# Patient Record
Sex: Female | Born: 1945 | Race: White | Hispanic: No | Smoking: Never smoker
Health system: Southern US, Community
[De-identification: ages and names within clinical notes are randomized; demographics above are authoritative.]

## PROBLEM LIST (undated history)

## (undated) ENCOUNTER — Ambulatory Visit: Discharge status: Absent without leave | Payer: Medicare Other

## (undated) DIAGNOSIS — I10 Essential (primary) hypertension: Secondary | ICD-10-CM

## (undated) DIAGNOSIS — I4891 Unspecified atrial fibrillation: Secondary | ICD-10-CM

## (undated) DIAGNOSIS — J45909 Unspecified asthma, uncomplicated: Secondary | ICD-10-CM

## (undated) HISTORY — PX: ABDOMINAL HYSTERECTOMY: SHX81

---

## 2007-05-15 ENCOUNTER — Ambulatory Visit: Payer: Self-pay | Admitting: Family Medicine

## 2010-09-26 ENCOUNTER — Ambulatory Visit: Payer: Self-pay | Admitting: Family Medicine

## 2011-08-22 ENCOUNTER — Ambulatory Visit: Payer: Self-pay | Admitting: Family Medicine

## 2012-01-07 ENCOUNTER — Ambulatory Visit: Payer: Self-pay | Admitting: Obstetrics and Gynecology

## 2013-04-07 ENCOUNTER — Ambulatory Visit: Payer: Self-pay | Admitting: Family Medicine

## 2013-04-07 DIAGNOSIS — Z1211 Encounter for screening for malignant neoplasm of colon: Secondary | ICD-10-CM | POA: Diagnosis not present

## 2013-04-07 DIAGNOSIS — D259 Leiomyoma of uterus, unspecified: Secondary | ICD-10-CM | POA: Diagnosis not present

## 2013-04-07 DIAGNOSIS — R300 Dysuria: Secondary | ICD-10-CM | POA: Diagnosis not present

## 2013-04-07 DIAGNOSIS — R1032 Left lower quadrant pain: Secondary | ICD-10-CM | POA: Diagnosis not present

## 2013-04-07 DIAGNOSIS — Z1389 Encounter for screening for other disorder: Secondary | ICD-10-CM | POA: Diagnosis not present

## 2013-04-07 DIAGNOSIS — K5730 Diverticulosis of large intestine without perforation or abscess without bleeding: Secondary | ICD-10-CM | POA: Diagnosis not present

## 2013-04-07 DIAGNOSIS — M5137 Other intervertebral disc degeneration, lumbosacral region: Secondary | ICD-10-CM | POA: Diagnosis not present

## 2013-04-07 DIAGNOSIS — K7689 Other specified diseases of liver: Secondary | ICD-10-CM | POA: Diagnosis not present

## 2013-04-07 LAB — CREATININE, SERUM
CREATININE: 0.68 mg/dL (ref 0.60–1.30)
EGFR (African American): >60
EGFR (Non-African Amer.): >60

## 2013-09-04 ENCOUNTER — Ambulatory Visit: Payer: Self-pay | Admitting: Family Medicine

## 2013-09-04 DIAGNOSIS — I4891 Unspecified atrial fibrillation: Secondary | ICD-10-CM | POA: Diagnosis not present

## 2013-09-04 DIAGNOSIS — J45909 Unspecified asthma, uncomplicated: Secondary | ICD-10-CM | POA: Diagnosis not present

## 2013-09-04 DIAGNOSIS — I10 Essential (primary) hypertension: Secondary | ICD-10-CM | POA: Diagnosis not present

## 2013-09-04 DIAGNOSIS — N809 Endometriosis, unspecified: Secondary | ICD-10-CM | POA: Diagnosis not present

## 2013-09-04 DIAGNOSIS — H6690 Otitis media, unspecified, unspecified ear: Secondary | ICD-10-CM | POA: Diagnosis not present

## 2013-09-04 DIAGNOSIS — R42 Dizziness and giddiness: Secondary | ICD-10-CM | POA: Diagnosis not present

## 2013-10-17 ENCOUNTER — Ambulatory Visit: Payer: Self-pay | Admitting: Otolaryngology

## 2013-10-17 DIAGNOSIS — H7190 Unspecified cholesteatoma, unspecified ear: Secondary | ICD-10-CM | POA: Diagnosis not present

## 2013-10-17 DIAGNOSIS — H6040 Cholesteatoma of external ear, unspecified ear: Secondary | ICD-10-CM | POA: Diagnosis not present

## 2013-10-20 DIAGNOSIS — H902 Conductive hearing loss, unspecified: Secondary | ICD-10-CM | POA: Diagnosis not present

## 2013-10-20 DIAGNOSIS — R42 Dizziness and giddiness: Secondary | ICD-10-CM | POA: Diagnosis not present

## 2013-10-27 DIAGNOSIS — Z1211 Encounter for screening for malignant neoplasm of colon: Secondary | ICD-10-CM | POA: Diagnosis not present

## 2013-10-27 DIAGNOSIS — I10 Essential (primary) hypertension: Secondary | ICD-10-CM | POA: Diagnosis not present

## 2013-10-27 DIAGNOSIS — Z1159 Encounter for screening for other viral diseases: Secondary | ICD-10-CM | POA: Diagnosis not present

## 2013-10-27 DIAGNOSIS — M25569 Pain in unspecified knee: Secondary | ICD-10-CM | POA: Diagnosis not present

## 2013-10-27 DIAGNOSIS — M1710 Unilateral primary osteoarthritis, unspecified knee: Secondary | ICD-10-CM | POA: Diagnosis not present

## 2013-10-27 DIAGNOSIS — IMO0002 Reserved for concepts with insufficient information to code with codable children: Secondary | ICD-10-CM | POA: Diagnosis not present

## 2013-10-27 DIAGNOSIS — Z1239 Encounter for other screening for malignant neoplasm of breast: Secondary | ICD-10-CM | POA: Diagnosis not present

## 2013-11-29 DIAGNOSIS — Z23 Encounter for immunization: Secondary | ICD-10-CM | POA: Diagnosis not present

## 2014-04-24 DIAGNOSIS — Z1211 Encounter for screening for malignant neoplasm of colon: Secondary | ICD-10-CM | POA: Diagnosis not present

## 2014-05-23 ENCOUNTER — Ambulatory Visit: Payer: Self-pay | Admitting: Family Medicine

## 2014-05-23 DIAGNOSIS — J40 Bronchitis, not specified as acute or chronic: Secondary | ICD-10-CM | POA: Diagnosis not present

## 2014-05-23 DIAGNOSIS — Z79899 Other long term (current) drug therapy: Secondary | ICD-10-CM | POA: Diagnosis not present

## 2014-05-23 DIAGNOSIS — Z7982 Long term (current) use of aspirin: Secondary | ICD-10-CM | POA: Diagnosis not present

## 2014-05-23 DIAGNOSIS — J9801 Acute bronchospasm: Secondary | ICD-10-CM | POA: Diagnosis not present

## 2014-05-23 DIAGNOSIS — N809 Endometriosis, unspecified: Secondary | ICD-10-CM | POA: Diagnosis not present

## 2014-05-23 DIAGNOSIS — R05 Cough: Secondary | ICD-10-CM | POA: Diagnosis not present

## 2014-05-23 DIAGNOSIS — I10 Essential (primary) hypertension: Secondary | ICD-10-CM | POA: Diagnosis not present

## 2014-05-23 DIAGNOSIS — J111 Influenza due to unidentified influenza virus with other respiratory manifestations: Secondary | ICD-10-CM | POA: Diagnosis not present

## 2014-05-23 DIAGNOSIS — I4891 Unspecified atrial fibrillation: Secondary | ICD-10-CM | POA: Diagnosis not present

## 2014-07-18 DIAGNOSIS — J309 Allergic rhinitis, unspecified: Secondary | ICD-10-CM | POA: Diagnosis not present

## 2014-07-18 DIAGNOSIS — J4530 Mild persistent asthma, uncomplicated: Secondary | ICD-10-CM | POA: Diagnosis not present

## 2014-09-24 ENCOUNTER — Ambulatory Visit
Admission: EM | Admit: 2014-09-24 | Discharge: 2014-09-24 | Disposition: A | Discharge status: Home / Self Care | Payer: Medicare Other | Attending: Family Medicine | Admitting: Family Medicine

## 2014-09-24 DIAGNOSIS — J069 Acute upper respiratory infection, unspecified: Secondary | ICD-10-CM

## 2014-09-24 DIAGNOSIS — J45901 Unspecified asthma with (acute) exacerbation: Secondary | ICD-10-CM | POA: Diagnosis not present

## 2014-09-24 HISTORY — DX: Essential (primary) hypertension: I10

## 2014-09-24 HISTORY — DX: Unspecified asthma, uncomplicated: J45.909

## 2014-09-24 MED ORDER — PREDNISONE 20 MG PO TABS: ORAL_TABLET | ORAL | Status: DC

## 2014-09-24 MED ORDER — AZITHROMYCIN 250 MG PO TABS: ORAL_TABLET | ORAL | Status: DC

## 2014-09-24 NOTE — ED Provider Notes (Addendum)
Patient presents today with symptoms of nasal congestion, productive cough, wheezing for the last 4 days. She does have asthma and has been taking her pro-air 2-3 times daily. She denies any chest pain, increased shortness of breath today, severe headache, nausea, vomiting, diarrhea, abdominal pain. Patient denies smoking history. She states the mucus is now slightly colored green.  ROS: Negative except mentioned above.  Vitals as per Epic. GENERAL: NAD HEENT: no pharyngeal erythema, no exudate, mild to moderate erythema of right ear, can't fully visualize canal (patient states she is currently following up with ENT regarding this), no cervical LAD RESP: mild expiratory wheezing, no rales or rhonci, no accessory muscle use, able to speak normally without stopping  CARD: RRR NEURO: CN II-XII grossly intact   A/P: URI/Asthma- Will treat with Z-Pak and oral prednisone for a few days, patient is to continue using her pro-air and Singulair. If her breathing symptoms persist or worsen she is to seek medical attention as instructed. Encouraged also rest, hydration.   Paulina Fusi, MD 09/24/14 [place], MD 09/24/14 [phone]

## 2014-09-24 NOTE — ED Notes (Signed)
Pt sick for past 4 days congested, coughing, sinus pressure. Hard to breath with her asthma

## 2015-01-05 DIAGNOSIS — Z23 Encounter for immunization: Secondary | ICD-10-CM | POA: Diagnosis not present

## 2015-02-22 DIAGNOSIS — I4891 Unspecified atrial fibrillation: Secondary | ICD-10-CM | POA: Diagnosis not present

## 2015-02-22 DIAGNOSIS — I480 Paroxysmal atrial fibrillation: Secondary | ICD-10-CM | POA: Diagnosis not present

## 2015-02-22 DIAGNOSIS — I4892 Unspecified atrial flutter: Secondary | ICD-10-CM | POA: Diagnosis not present

## 2015-02-22 DIAGNOSIS — I10 Essential (primary) hypertension: Secondary | ICD-10-CM | POA: Diagnosis not present

## 2015-02-22 DIAGNOSIS — Z79899 Other long term (current) drug therapy: Secondary | ICD-10-CM | POA: Diagnosis not present

## 2015-02-28 DIAGNOSIS — J4530 Mild persistent asthma, uncomplicated: Secondary | ICD-10-CM | POA: Diagnosis not present

## 2015-02-28 DIAGNOSIS — J309 Allergic rhinitis, unspecified: Secondary | ICD-10-CM | POA: Diagnosis not present

## 2015-03-20 DIAGNOSIS — H9011 Conductive hearing loss, unilateral, right ear, with unrestricted hearing on the contralateral side: Secondary | ICD-10-CM | POA: Diagnosis not present

## 2015-03-20 DIAGNOSIS — R42 Dizziness and giddiness: Secondary | ICD-10-CM | POA: Diagnosis not present

## 2015-03-22 DIAGNOSIS — H8111 Benign paroxysmal vertigo, right ear: Secondary | ICD-10-CM | POA: Diagnosis not present

## 2015-07-03 DIAGNOSIS — I4892 Unspecified atrial flutter: Secondary | ICD-10-CM | POA: Diagnosis not present

## 2015-07-03 DIAGNOSIS — I10 Essential (primary) hypertension: Secondary | ICD-10-CM | POA: Diagnosis not present

## 2015-07-03 DIAGNOSIS — I480 Paroxysmal atrial fibrillation: Secondary | ICD-10-CM | POA: Diagnosis not present

## 2015-07-03 DIAGNOSIS — I4891 Unspecified atrial fibrillation: Secondary | ICD-10-CM | POA: Diagnosis not present

## 2015-08-02 DIAGNOSIS — R0602 Shortness of breath: Secondary | ICD-10-CM | POA: Diagnosis not present

## 2015-08-02 DIAGNOSIS — J4520 Mild intermittent asthma, uncomplicated: Secondary | ICD-10-CM | POA: Diagnosis not present

## 2015-11-23 DIAGNOSIS — Z23 Encounter for immunization: Secondary | ICD-10-CM | POA: Diagnosis not present

## 2016-01-31 DIAGNOSIS — J310 Chronic rhinitis: Secondary | ICD-10-CM | POA: Diagnosis not present

## 2016-01-31 DIAGNOSIS — J4520 Mild intermittent asthma, uncomplicated: Secondary | ICD-10-CM | POA: Diagnosis not present

## 2016-02-07 DIAGNOSIS — R0789 Other chest pain: Secondary | ICD-10-CM | POA: Diagnosis not present

## 2016-02-07 DIAGNOSIS — I4891 Unspecified atrial fibrillation: Secondary | ICD-10-CM | POA: Diagnosis not present

## 2016-02-07 DIAGNOSIS — I10 Essential (primary) hypertension: Secondary | ICD-10-CM | POA: Diagnosis not present

## 2016-02-07 DIAGNOSIS — I480 Paroxysmal atrial fibrillation: Secondary | ICD-10-CM | POA: Diagnosis not present

## 2016-02-07 DIAGNOSIS — I4892 Unspecified atrial flutter: Secondary | ICD-10-CM | POA: Diagnosis not present

## 2016-02-07 DIAGNOSIS — J4520 Mild intermittent asthma, uncomplicated: Secondary | ICD-10-CM | POA: Diagnosis not present

## 2016-06-07 IMAGING — CT CT TEMPORAL BONES WITHOUT CONTRAST
3 of 6 series · 16 of 40 positions shown, 19 images · non-contrast
Comparison: None.

CLINICAL DATA: Cholesteatoma

EXAM:
CT TEMPORAL BONES WITHOUT CONTRAST
TECHNIQUE: Axial and coronal plane CT imaging of the petrous temporal bones was
performed with thin-collimation image reconstruction. No intravenous
contrast was administered. Multiplanar CT image reconstructions were
also generated.

[Series 3: ax soft · axial · 0.33mm/px · z∈[+26,+44]mm · 2 of 28 slices shown]
[im 10/28  brain]
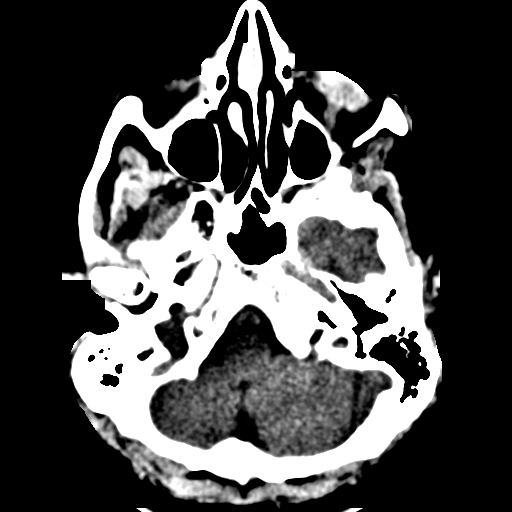
[im 19/28  brain]
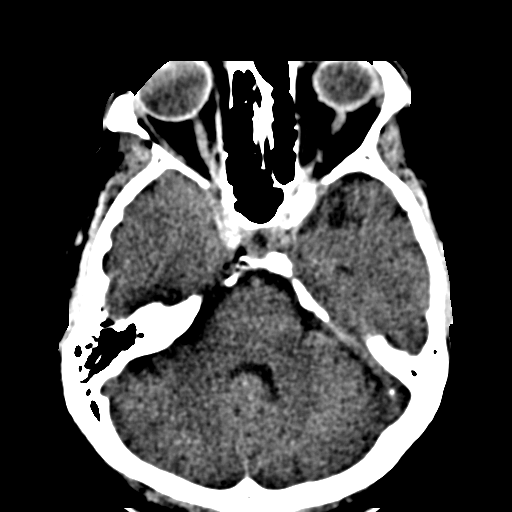

[Series 4: coronal bone · coronal · 0.33mm/px · 3 of 254 slices shown]
[im 51/254  bone]
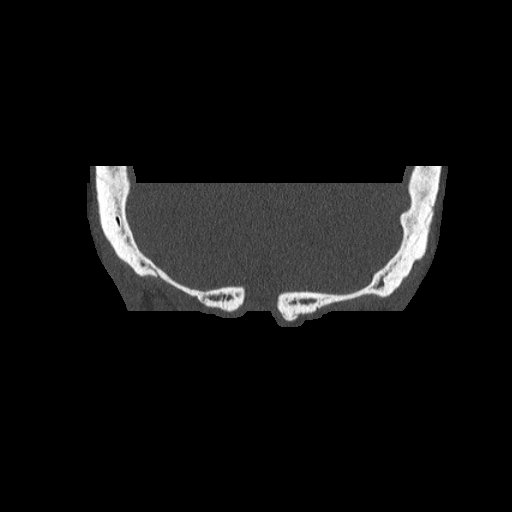
[im 102/254  bone]
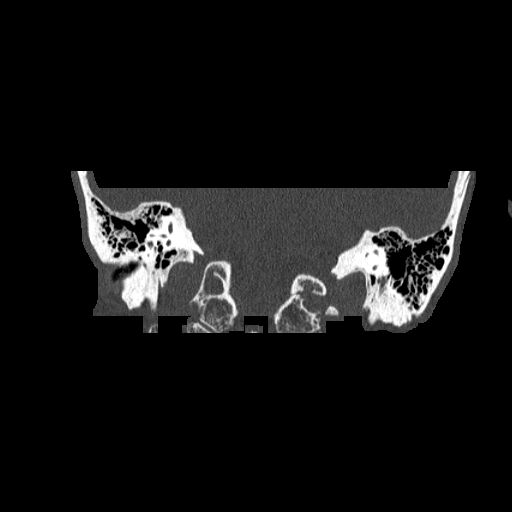
[im 152/254  bone]
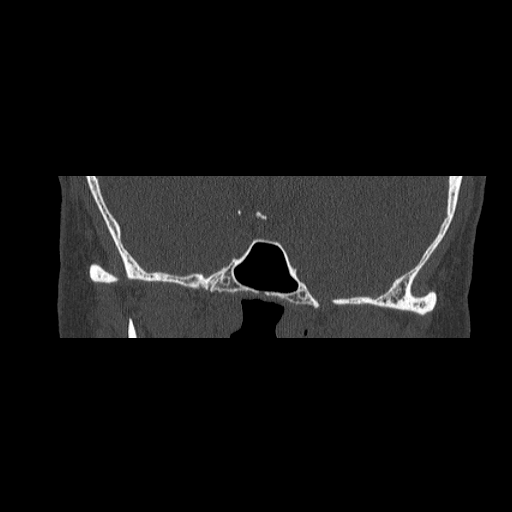

[Series 5: ax mag right · axial · 0.20mm/px · z∈[+12,+57]mm · 11 of 91 slices shown, 14 images]
[im 8/91  brain]
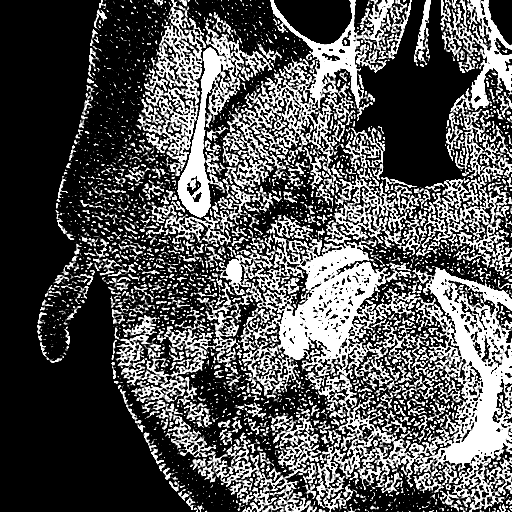
[im 8/91  bone]
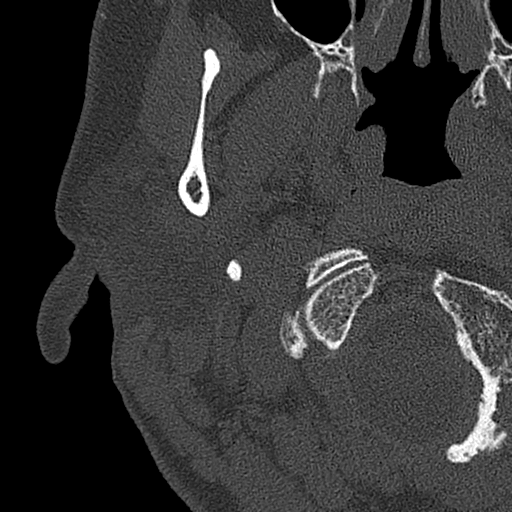
[im 16/91  bone]
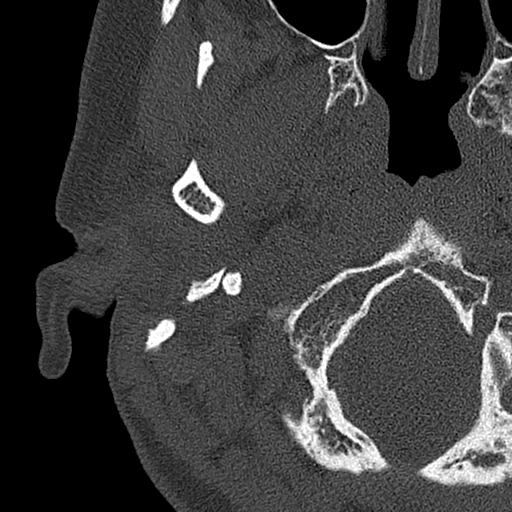
[im 23/91  bone]
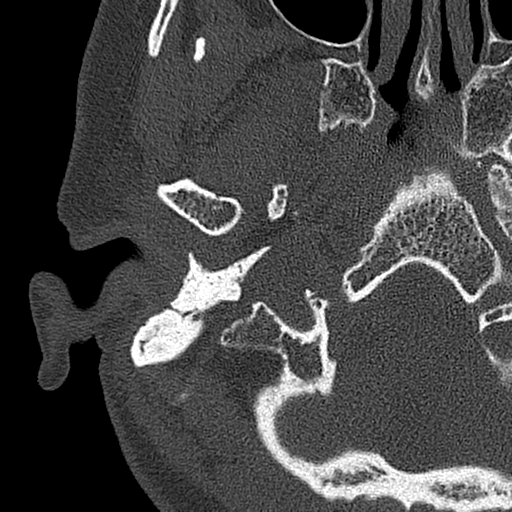
[im 31/91  bone]
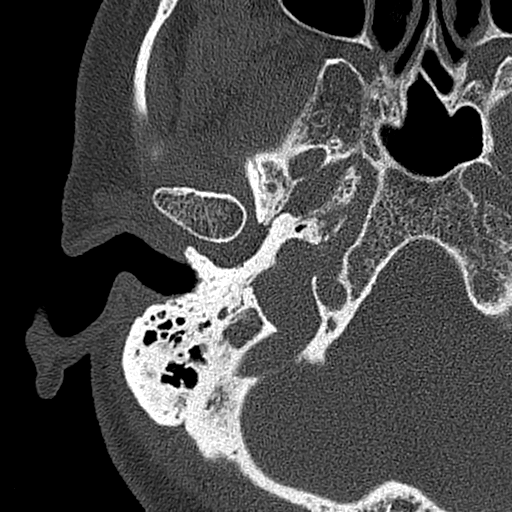
[im 38/91  brain]
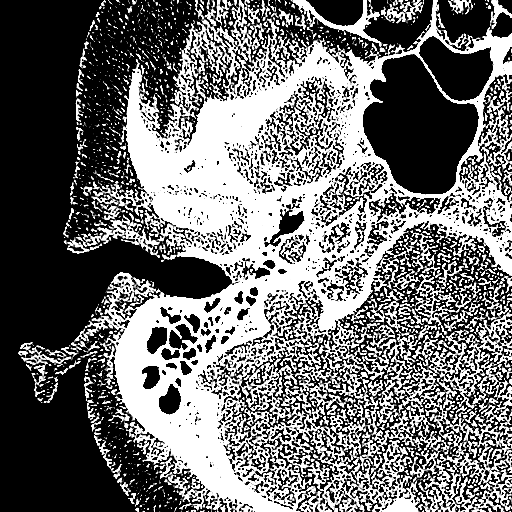
[im 38/91  bone]
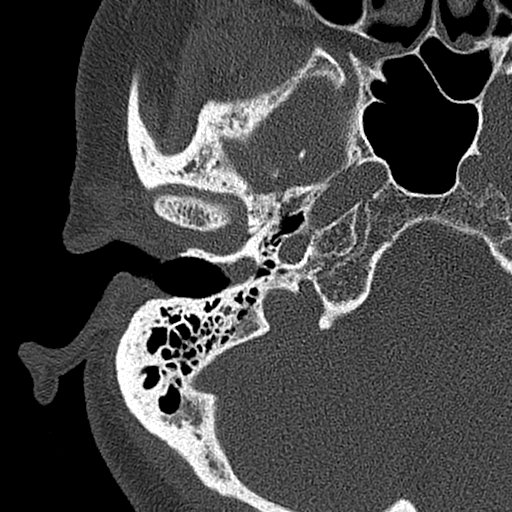
[im 46/91  bone]
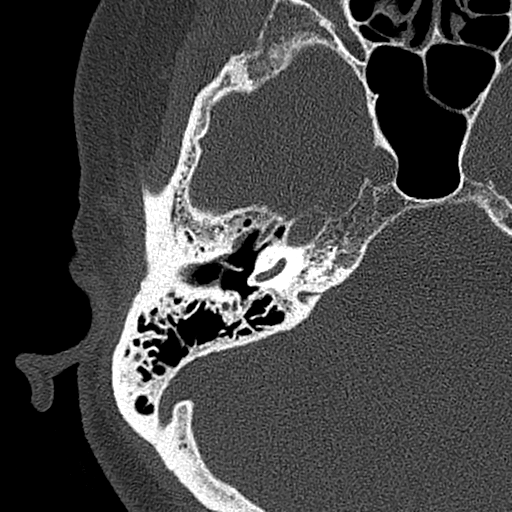
[im 53/91  bone]
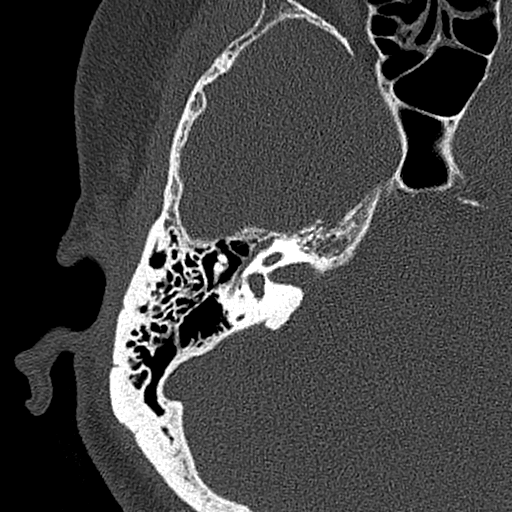
[im 61/91  bone]
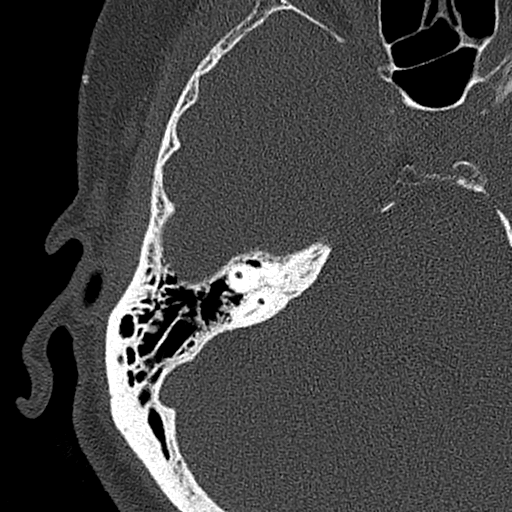
[im 68/91  brain]
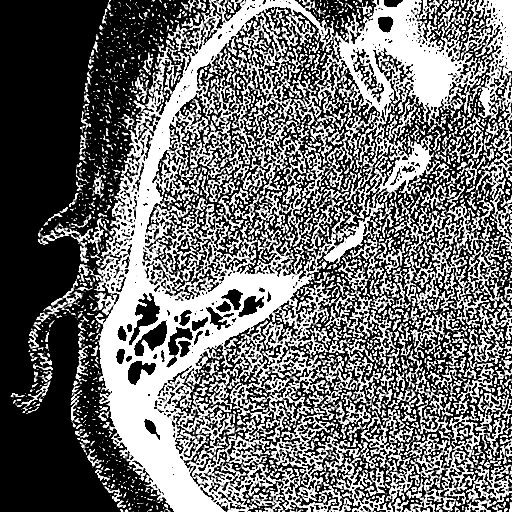
[im 68/91  bone]
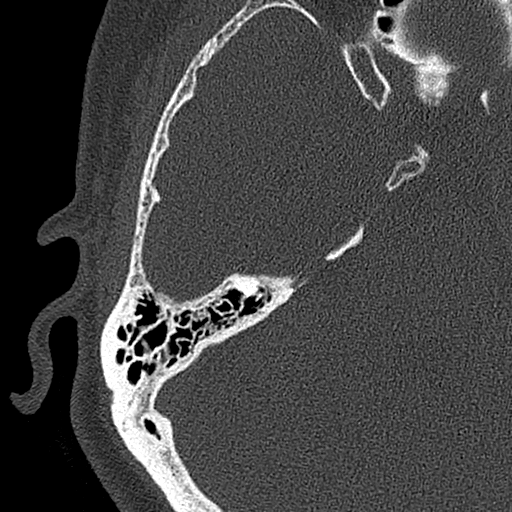
[im 76/91  bone]
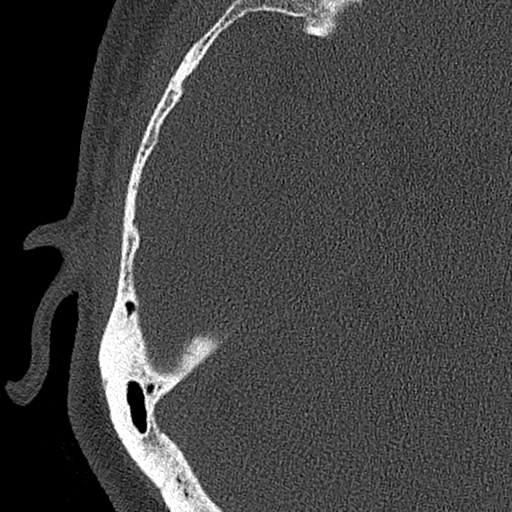
[im 83/91  bone]
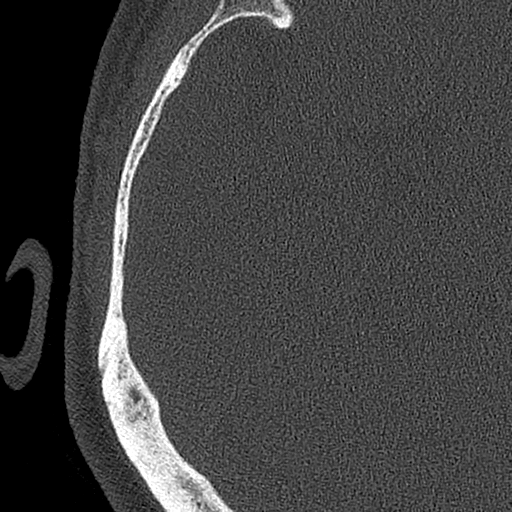

[16 of 40 positions shown; findings below may reference images not displayed]

FINDINGS: Soft tissue is noted along the medial aspect of the right external
auditory canal extending to the tympanic membrane. There is no
significant soft tissue or fluid medial to the tympanic membrane.
There is no osseous destruction. The middle ear ossicles are
normally formed and articulating. The oval window is patent. The
middle ear cavity and mastoid air cells are clear. The inner ear
structures are normally formed. The cochlea is intact. The
semicircular canals are covered. The seventh cranial nerve is
unremarkable. The internal auditory canal and vestibular aqueduct
are normal.

The left external auditory canal is clear. The tympanic membrane is
visualized and intact. The left middle ear ossicles are normally
formed and articulating. The oval window is patent. The left middle
ear cavity and mastoid air cells are clear. The inner ear structures
are normally formed. The cochlear is intact. Semicircular canals are
covered. The seventh cranial nerves is unremarkable. The internal
auditory canal and vestibular aqueduct are normal. A high-riding
jugular bulb is evident on the left. The sigmoid plate is intact.
IMPRESSION: 1. Soft tissue along the medial aspect of the right external
auditory canal is most compatible with keratosis obturans. A
destructive inflammatory process is less likely. No bone destruction
is evident.
2. A high-riding jugular bulb on the left. The sigmoid plate is
intact.

## 2016-07-24 DIAGNOSIS — I4892 Unspecified atrial flutter: Secondary | ICD-10-CM | POA: Diagnosis not present

## 2016-07-24 DIAGNOSIS — I10 Essential (primary) hypertension: Secondary | ICD-10-CM | POA: Diagnosis not present

## 2016-07-24 DIAGNOSIS — J4520 Mild intermittent asthma, uncomplicated: Secondary | ICD-10-CM | POA: Diagnosis not present

## 2016-07-24 DIAGNOSIS — I4891 Unspecified atrial fibrillation: Secondary | ICD-10-CM | POA: Diagnosis not present

## 2016-07-24 DIAGNOSIS — I480 Paroxysmal atrial fibrillation: Secondary | ICD-10-CM | POA: Diagnosis not present

## 2016-10-07 DIAGNOSIS — J4530 Mild persistent asthma, uncomplicated: Secondary | ICD-10-CM | POA: Diagnosis not present

## 2016-10-07 DIAGNOSIS — R490 Dysphonia: Secondary | ICD-10-CM | POA: Diagnosis not present

## 2016-10-07 DIAGNOSIS — J309 Allergic rhinitis, unspecified: Secondary | ICD-10-CM | POA: Diagnosis not present

## 2016-11-19 DIAGNOSIS — Z23 Encounter for immunization: Secondary | ICD-10-CM | POA: Diagnosis not present

## 2017-02-03 DIAGNOSIS — J4530 Mild persistent asthma, uncomplicated: Secondary | ICD-10-CM | POA: Diagnosis not present

## 2017-02-03 DIAGNOSIS — I10 Essential (primary) hypertension: Secondary | ICD-10-CM | POA: Diagnosis not present

## 2017-02-03 DIAGNOSIS — R002 Palpitations: Secondary | ICD-10-CM | POA: Diagnosis not present

## 2017-02-03 DIAGNOSIS — R0602 Shortness of breath: Secondary | ICD-10-CM | POA: Diagnosis not present

## 2017-02-03 DIAGNOSIS — I4891 Unspecified atrial fibrillation: Secondary | ICD-10-CM | POA: Diagnosis not present

## 2017-02-03 DIAGNOSIS — I4892 Unspecified atrial flutter: Secondary | ICD-10-CM | POA: Diagnosis not present

## 2017-02-17 DIAGNOSIS — J309 Allergic rhinitis, unspecified: Secondary | ICD-10-CM | POA: Diagnosis not present

## 2017-02-17 DIAGNOSIS — J4530 Mild persistent asthma, uncomplicated: Secondary | ICD-10-CM | POA: Diagnosis not present

## 2017-03-05 DIAGNOSIS — I480 Paroxysmal atrial fibrillation: Secondary | ICD-10-CM | POA: Diagnosis not present

## 2017-03-05 DIAGNOSIS — I10 Essential (primary) hypertension: Secondary | ICD-10-CM | POA: Diagnosis not present

## 2017-03-05 DIAGNOSIS — R002 Palpitations: Secondary | ICD-10-CM | POA: Diagnosis not present

## 2017-03-05 DIAGNOSIS — R0602 Shortness of breath: Secondary | ICD-10-CM | POA: Diagnosis not present

## 2017-07-08 ENCOUNTER — Other Ambulatory Visit: Payer: Self-pay | Admitting: Family Medicine

## 2017-07-08 DIAGNOSIS — Z780 Asymptomatic menopausal state: Secondary | ICD-10-CM

## 2017-07-08 DIAGNOSIS — N644 Mastodynia: Secondary | ICD-10-CM

## 2017-07-25 ENCOUNTER — Encounter: Payer: Self-pay | Admitting: Gynecology

## 2017-07-25 ENCOUNTER — Ambulatory Visit
Admission: EM | Admit: 2017-07-25 | Discharge: 2017-07-25 | Disposition: A | Discharge status: Home / Self Care | Payer: Medicare Other | Attending: Family Medicine | Admitting: Family Medicine

## 2017-07-25 ENCOUNTER — Other Ambulatory Visit: Payer: Self-pay

## 2017-07-25 DIAGNOSIS — H6503 Acute serous otitis media, bilateral: Secondary | ICD-10-CM | POA: Diagnosis not present

## 2017-07-25 DIAGNOSIS — J40 Bronchitis, not specified as acute or chronic: Secondary | ICD-10-CM

## 2017-07-25 MED ORDER — AMOXICILLIN-POT CLAVULANATE 875-125 MG PO TABS: 1.0000 | ORAL_TABLET | Freq: Two times a day (BID) | ORAL | 0 refills | Status: DC

## 2017-07-25 MED ORDER — HYDROCOD POLST-CPM POLST ER 10-8 MG/5ML PO SUER: 5.0000 mL | Freq: Every evening | ORAL | 0 refills | Status: DC | PRN

## 2017-07-25 NOTE — ED Triage Notes (Signed)
Patient c/o x 2 weeks cough / congestion/ dizziness.

## 2017-07-25 NOTE — ED Provider Notes (Signed)
[facility]    CSN: [id number] Arrival date & time: 07/25/17  0813     History   Chief Complaint Chief Complaint  Patient presents with  . Cough    HPI Cathy Keith is a 72 y.o. female.   The history is provided by the patient.  Cough  Associated symptoms: ear pain and rhinorrhea   Associated symptoms: no wheezing   URI  Presenting symptoms: congestion, cough, ear pain and rhinorrhea   Severity:  Moderate Onset quality:  Sudden Duration:  2 weeks Timing:  Constant Progression:  Worsening Chronicity:  New Relieved by:  Nothing Ineffective treatments:  OTC medications Associated symptoms: no sinus pain and no wheezing   Risk factors: being elderly and sick contacts     Past Medical History:  Diagnosis Date  . Asthma   . Hypertension     There are no active problems to display for this patient.   Past Surgical History:  Procedure Laterality Date  . ABDOMINAL HYSTERECTOMY      OB History   None      Home Medications    Prior to Admission medications   Medication Sig Start Date End Date Taking? Authorizing Provider  diltiazem (CARDIZEM CD) 120 MG 24 hr capsule Take 120 mg by mouth daily.   Yes [provider]  montelukast (SINGULAIR) 10 MG tablet Take 10 mg by mouth at bedtime.   Yes [provider]  albuterol (VENTOLIN HFA) 108 (90 Base) MCG/ACT inhaler Inhale into the lungs.    [provider]  amoxicillin-clavulanate (AUGMENTIN) 875-125 MG tablet Take 1 tablet by mouth 2 (two) times daily. 07/25/17   Norval Gable, MD  aspirin 81 MG tablet Take 81 mg by mouth daily.    [provider]  azithromycin (ZITHROMAX Z-PAK) 250 MG tablet Use as directed for 5 days. 09/24/14   Paulina Fusi, MD  chlorpheniramine-HYDROcodone [facility] PENNKINETIC ER) 10-8 MG/5ML SUER Take 5 mLs by mouth at bedtime as needed. 07/25/17   Norval Gable, MD  ELIQUIS 5 MG TABS tablet  07/15/17   [provider]    predniSONE (DELTASONE) 20 MG tablet Take two tabs daily for 4 days. 09/24/14   Paulina Fusi, MD    Family History History reviewed. No pertinent family history.  Social History Social History   Tobacco Use  . Smoking status: Never Smoker  . Smokeless tobacco: Never Used  Substance Use Topics  . Alcohol use: Yes    Comment: occasional  . Drug use: No     Allergies   Patient has no known allergies.   Review of Systems Review of Systems  HENT: Positive for congestion, ear pain and rhinorrhea. Negative for sinus pain.   Respiratory: Positive for cough. Negative for wheezing.      Physical Exam Triage Vital Signs ED Triage Vitals  Enc Vitals Group     BP 07/25/17 0832 119/60     Pulse Rate 07/25/17 0832 81     Resp 07/25/17 0832 16     Temp 07/25/17 0832 98.2 F (36.8 C)     Temp Source 07/25/17 0832 Oral     SpO2 07/25/17 0832 98 %     Weight 07/25/17 0832 138 lb (62.6 kg)     Height 07/25/17 0832 5\' 5"  (1.651 m)     Head Circumference --      Peak Flow --      Pain Score 07/25/17 0831 0     Pain Loc --  Pain Edu? --      Excl. in [place]? --    No data found.  Updated Vital Signs BP 119/60 (BP Location: Left Arm)   Pulse 81   Temp 98.2 F (36.8 C) (Oral)   Resp 16   Ht 5\' 5"  (1.651 m)   Wt 138 lb (62.6 kg)   SpO2 98%   BMI 22.96 kg/m   Visual Acuity Right Eye Distance:   Left Eye Distance:   Bilateral Distance:    Right Eye Near:   Left Eye Near:    Bilateral Near:     Physical Exam  Constitutional: She appears well-developed and well-nourished. No distress.  HENT:  Head: Normocephalic and atraumatic.  Right Ear: External ear and ear canal normal. Tympanic membrane is erythematous and bulging. A middle ear effusion is present.  Left Ear: External ear and ear canal normal. Tympanic membrane is erythematous and bulging. A middle ear effusion is present.  Nose: Mucosal edema and rhinorrhea present. No nose lacerations, sinus tenderness, nasal  deformity, septal deviation or nasal septal hematoma. No epistaxis.  No foreign bodies. Right sinus exhibits maxillary sinus tenderness and frontal sinus tenderness. Left sinus exhibits maxillary sinus tenderness and frontal sinus tenderness.  Mouth/Throat: Uvula is midline, oropharynx is clear and moist and mucous membranes are normal. No oropharyngeal exudate.  Eyes: Conjunctivae are normal. Right eye exhibits no discharge. Left eye exhibits no discharge. No scleral icterus.  Neck: Normal range of motion. Neck supple. No thyromegaly present.  Cardiovascular: Normal rate, regular rhythm and normal heart sounds.  Pulmonary/Chest: Effort normal. No stridor. No respiratory distress. She has no wheezes. She has no rales.  Rhonchi left base  Lymphadenopathy:    She has no cervical adenopathy.  Skin: She is not diaphoretic.  Nursing note and vitals reviewed.    UC Treatments / Results  Labs (all labs ordered are listed, but only abnormal results are displayed) Labs Reviewed - No data to display  EKG None  Radiology No results found.  Procedures Procedures (including critical care time)  Medications Ordered in UC Medications - No data to display  Initial Impression / Assessment and Plan / UC Course  I have reviewed the triage vital signs and the nursing notes.  Pertinent labs & imaging results that were available during my care of the patient were reviewed by me and considered in my medical decision making (see chart for details).      Final Clinical Impressions(s) / UC Diagnoses   Final diagnoses:  Bilateral acute serous otitis media, recurrence not specified  Bronchitis    ED Prescriptions    Medication Sig Dispense Auth. Provider   amoxicillin-clavulanate (AUGMENTIN) 875-125 MG tablet Take 1 tablet by mouth 2 (two) times daily. 20 tablet Norval Gable, MD   chlorpheniramine-HYDROcodone (TUSSIONEX PENNKINETIC ER) 10-8 MG/5ML SUER Take 5 mLs by mouth at bedtime as needed.  60 mL Norval Gable, MD     1. diagnosis reviewed with patient 2. rx as per orders above; reviewed possible side effects, interactions, risks and benefits  3. Recommend supportive treatment with otc analgesics, rest, fluids 4. Follow-up prn if symptoms worsen or don't improve  Controlled Substance Prescriptions [place] Controlled Substance Registry consulted? Not Applicable   Norval Gable, MD 07/25/17 0900

## 2017-08-01 ENCOUNTER — Ambulatory Visit
Admission: EM | Admit: 2017-08-01 | Discharge: 2017-08-01 | Disposition: A | Discharge status: Home / Self Care | Payer: Medicare Other | Attending: Emergency Medicine | Admitting: Emergency Medicine

## 2017-08-01 ENCOUNTER — Encounter: Payer: Self-pay | Admitting: Gynecology

## 2017-08-01 DIAGNOSIS — H6982 Other specified disorders of Eustachian tube, left ear: Secondary | ICD-10-CM | POA: Diagnosis not present

## 2017-08-01 NOTE — ED Provider Notes (Signed)
[facility]    CSN: [id number] Arrival date & time: 08/01/17  1158     History   Chief Complaint Chief Complaint  Patient presents with  . Otalgia    HPI Cathy Keith is a 72 y.o. female.   HPI  72 year old female presents with left ear pain creased hearing all the dizziness she has had for 1 week.  She was seen here 07/11/2017 diagnosed with bilateral otitis media with effusion and bronchitis.  She was treated with Augmentin and Tussionex.  Coughing has stopped completely but she is still left with the left ear pain and decreased hearing which is what her main problem is today.  No fever or chills.  Has mild dizziness but it seems to be worsening.                 Past Medical History:  Diagnosis Date  . Asthma   . Hypertension     There are no active problems to display for this patient.   Past Surgical History:  Procedure Laterality Date  . ABDOMINAL HYSTERECTOMY      OB History   None      Home Medications    Prior to Admission medications   Medication Sig Start Date End Date Taking? Authorizing Provider  albuterol (VENTOLIN HFA) 108 (90 Base) MCG/ACT inhaler Inhale into the lungs.   Yes [provider]  amoxicillin-clavulanate (AUGMENTIN) 875-125 MG tablet Take 1 tablet by mouth 2 (two) times daily. 07/25/17  Yes Norval Gable, MD  aspirin 81 MG tablet Take 81 mg by mouth daily.   Yes [provider]  chlorpheniramine-HYDROcodone (TUSSIONEX PENNKINETIC ER) 10-8 MG/5ML SUER Take 5 mLs by mouth at bedtime as needed. 07/25/17  Yes Norval Gable, MD  diltiazem (CARDIZEM CD) 120 MG 24 hr capsule Take 120 mg by mouth daily.   Yes [provider]  ELIQUIS 5 MG TABS tablet  07/15/17  Yes [provider]  montelukast (SINGULAIR) 10 MG tablet Take 10 mg by mouth at bedtime.   Yes [provider]    Family History History reviewed. No pertinent family history.  Social History Social History    Tobacco Use  . Smoking status: Never Smoker  . Smokeless tobacco: Never Used  Substance Use Topics  . Alcohol use: Yes    Comment: occasional  . Drug use: No     Allergies   Patient has no known allergies.   Review of Systems Review of Systems  Constitutional: Positive for activity change. Negative for chills, fatigue and fever.  HENT: Positive for ear pain and hearing loss.   All other systems reviewed and are negative.    Physical Exam Triage Vital Signs ED Triage Vitals  Enc Vitals Group     BP 08/01/17 1235 118/65     Pulse Rate 08/01/17 1235 73     Resp 08/01/17 1235 16     Temp --      Temp Source 08/01/17 1235 Oral     SpO2 08/01/17 1235 97 %     Weight 08/01/17 1235 138 lb (62.6 kg)     Height --      Head Circumference --      Peak Flow --      Pain Score 08/01/17 1236 0     Pain Loc --      Pain Edu? --      Excl. in [place]? --    No data found.  Updated Vital Signs BP  118/65 (BP Location: Right Arm)   Pulse 73   Resp 16   Wt 138 lb (62.6 kg)   SpO2 97%   BMI 22.96 kg/m   Visual Acuity Right Eye Distance:   Left Eye Distance:   Bilateral Distance:    Right Eye Near:   Left Eye Near:    Bilateral Near:     Physical Exam  Constitutional: She is oriented to person, place, and time. She appears well-developed and well-nourished. No distress.  HENT:  Head: Normocephalic.  Right Ear: External ear normal.  Left Ear: External ear normal.  Nose: Nose normal.  Mouth/Throat: Oropharynx is clear and moist. No oropharyngeal exudate.  Examination of the right ear shows it to being erythematous with loss of landmarks and light reflex.  There are air-fluid levels present.  Left ear shows normal landmarks and light reflex.  There appears to be dullness of the TM. Canal is slightly decreased in comparison to the right.  She has no pain with movement of the tragus or the auricle.  Eyes: Pupils are equal, round, and reactive to light. Right eye exhibits no  discharge. Left eye exhibits no discharge.  Neck: Normal range of motion.  Pulmonary/Chest: Effort normal and breath sounds normal.  Musculoskeletal: Normal range of motion.  Lymphadenopathy:    She has no cervical adenopathy.  Neurological: She is alert and oriented to person, place, and time.  Skin: Skin is warm and dry. She is not diaphoretic.  Psychiatric: She has a normal mood and affect. Her behavior is normal. Judgment and thought content normal.  Nursing note and vitals reviewed.    UC Treatments / Results  Labs (all labs ordered are listed, but only abnormal results are displayed) Labs Reviewed - No data to display  EKG None  Radiology No results found.  Procedures Procedures (including critical care time)  Medications Ordered in UC Medications - No data to display  Initial Impression / Assessment and Plan / UC Course  I have reviewed the triage vital signs and the nursing notes.  Pertinent labs & imaging results that were available during my care of the patient were reviewed by me and considered in my medical decision making (see chart for details).     Plan: 1. Test/x-ray results and diagnosis reviewed with patient 2. rx as per orders; risks, benefits, potential side effects reviewed with patient 3. Recommend supportive treatment with use of Flonase nasal spray daily for several weeks.  Finish up the antibiotic prescribed for you.  You are not improving in 1 week suggest you follow-up with ear nose and throat 4. F/u prn if symptoms worsen or don't improve  Final Clinical Impressions(s) / UC Diagnoses   Final diagnoses:  Eustachian tube dysfunction, left   Discharge Instructions   None    ED Prescriptions    None     Controlled Substance Prescriptions [place] Controlled Substance Registry consulted? Not Applicable   Lorin Picket, PA-C 08/01/17 1353

## 2017-08-01 NOTE — ED Triage Notes (Signed)
Patient c/o left ear pain x 1 week.

## 2017-08-10 ENCOUNTER — Other Ambulatory Visit: Payer: Self-pay

## 2017-08-10 ENCOUNTER — Ambulatory Visit
Admission: EM | Admit: 2017-08-10 | Discharge: 2017-08-10 | Disposition: A | Discharge status: Home / Self Care | Payer: Medicare Other | Attending: Registered Nurse | Admitting: Registered Nurse

## 2017-08-10 ENCOUNTER — Encounter: Payer: Self-pay | Admitting: Emergency Medicine

## 2017-08-10 DIAGNOSIS — H6983 Other specified disorders of Eustachian tube, bilateral: Secondary | ICD-10-CM

## 2017-08-10 DIAGNOSIS — H9192 Unspecified hearing loss, left ear: Secondary | ICD-10-CM | POA: Diagnosis not present

## 2017-08-10 MED ORDER — SALINE SPRAY 0.65 % NA SOLN: 2.0000 | NASAL | 0 refills | Status: DC

## 2017-08-10 MED ORDER — LORATADINE 10 MG PO TABS: 10.0000 mg | ORAL_TABLET | Freq: Every day | ORAL | 0 refills | Status: DC

## 2017-08-10 NOTE — ED Triage Notes (Signed)
Patient in today c/o left ear fullness "stopped up" x 3 weeks. Patient denies fever. Patient has been using Flonase daily.

## 2017-08-10 NOTE — ED Provider Notes (Signed)
[facility]    CSN: [id number] Arrival date & time: 08/10/17  0845     History   Chief Complaint Chief Complaint  Patient presents with  . ear stopped up    left    HPI Cathy Keith is a 72 y.o. female.   72y/o hispanic established female patient here with daughter for recheck left ear.  Feeling better except decreased hearing and popping in left ear s/p augmentin course for ear infection.  Has noticed still some post nasal drip.  Had a cold and bronchitis then ear infection last seen by PA Roemer 11 May (started flonase) and Dr Zenda Alpers 4 May augmentin, tussionex.  Finished augmentin course and no longer taking tussionex or albuterol inhaler.  Flonase instilling in one nostril not using nasal saline on singulair daily doesn't use antihistamine denied seasonal allergies.     Past Medical History:  Diagnosis Date  . Asthma   . Hypertension     There are no active problems to display for this patient.   Past Surgical History:  Procedure Laterality Date  . ABDOMINAL HYSTERECTOMY      OB History   None      Home Medications    Prior to Admission medications   Medication Sig Start Date End Date Taking? Authorizing Provider  diltiazem (CARDIZEM CD) 120 MG 24 hr capsule Take 120 mg by mouth daily.   Yes [provider]  ELIQUIS 5 MG TABS tablet  07/15/17  Yes [provider]  montelukast (SINGULAIR) 10 MG tablet Take 10 mg by mouth at bedtime.   Yes [provider]  albuterol (VENTOLIN HFA) 108 (90 Base) MCG/ACT inhaler Inhale into the lungs.    [provider]  loratadine (CLARITIN) 10 MG tablet Take 1 tablet (10 mg total) by mouth daily. 08/10/17 09/09/17  Talvin Christianson, Aura Fey, NP  sodium chloride (OCEAN) 0.65 % SOLN nasal spray Place 2 sprays into both nostrils every 2 (two) hours while awake. 08/10/17 09/09/17  Dietrick Barris, Aura Fey, NP    Family History Family History  Problem Relation Age of Onset  . Dementia Mother    . Heart disease Father   . Heart attack Father     Social History Social History   Tobacco Use  . Smoking status: Never Smoker  . Smokeless tobacco: Never Used  Substance Use Topics  . Alcohol use: Yes    Comment: occasional  . Drug use: No     Allergies   Patient has no known allergies.   Review of Systems Review of Systems  Constitutional: Negative for activity change, appetite change, chills, diaphoresis, fatigue, fever and unexpected weight change.  HENT: Negative for congestion, dental problem, drooling, ear discharge, ear pain, facial swelling, hearing loss, mouth sores, nosebleeds, postnasal drip, rhinorrhea, sinus pressure, sinus pain, sneezing, sore throat, tinnitus, trouble swallowing and voice change.   Eyes: Negative for photophobia, pain, discharge, redness, itching and visual disturbance.  Respiratory: Negative for cough, choking, shortness of breath, wheezing and stridor.   Cardiovascular: Negative for chest pain, palpitations and leg swelling.  Gastrointestinal: Negative for abdominal distention, abdominal pain, blood in stool, constipation, diarrhea, nausea and vomiting.  Endocrine: Negative for cold intolerance and heat intolerance.  Genitourinary: Negative for difficulty urinating.  Musculoskeletal: Negative for arthralgias, back pain, gait problem, myalgias, neck pain and neck stiffness.  Skin: Negative for color change, pallor, rash and wound.  Allergic/Immunologic: Negative for environmental allergies and food allergies.  Neurological: Negative for dizziness, tremors, seizures, syncope,  facial asymmetry, speech difficulty, weakness, light-headedness, numbness and headaches.  Hematological: Negative for adenopathy. Does not bruise/bleed easily.  Psychiatric/Behavioral: Negative for agitation, confusion and sleep disturbance.     Physical Exam Triage Vital Signs ED Triage Vitals [08/10/17 0912]  Enc Vitals Group     BP 136/64     Pulse Rate 62      Resp 16     Temp 98.1 F (36.7 C)     Temp Source Oral     SpO2 99 %     Weight 138 lb (62.6 kg)     Height 5\' 5"  (1.651 m)     Head Circumference      Peak Flow      Pain Score 0     Pain Loc      Pain Edu?      Excl. in [place]?    No data found.  Updated Vital Signs BP 136/64 (BP Location: Left Arm)   Pulse 62   Temp 98.1 F (36.7 C) (Oral)   Resp 16   Ht 5\' 5"  (1.651 m)   Wt 138 lb (62.6 kg)   SpO2 99%   BMI 22.96 kg/m   Physical Exam  Constitutional: She is oriented to person, place, and time. Vital signs are normal. She appears well-developed and well-nourished. She is active and cooperative.  Non-toxic appearance. She does not have a sickly appearance. She does not appear ill. No distress.  HENT:  Head: Normocephalic and atraumatic.  Right Ear: Hearing, external ear and ear canal normal. Tympanic membrane is scarred. A middle ear effusion is present.  Left Ear: Hearing, external ear and ear canal normal. A middle ear effusion is present.  Nose: Mucosal edema and rhinorrhea present. No nose lacerations, sinus tenderness, nasal deformity, septal deviation or nasal septal hematoma. No epistaxis.  No foreign bodies. Right sinus exhibits no maxillary sinus tenderness and no frontal sinus tenderness. Left sinus exhibits no maxillary sinus tenderness and no frontal sinus tenderness.  Mouth/Throat: Uvula is midline and mucous membranes are normal. Mucous membranes are not pale, not dry and not cyanotic. She does not have dentures. No oral lesions. No trismus in the jaw. Normal dentition. No dental abscesses, uvula swelling, lacerations or dental caries. Posterior oropharyngeal edema and posterior oropharyngeal erythema present. No oropharyngeal exudate or tonsillar abscesses.  Cobblestoning posterior pharynx; bilateral TMs air fluid level clear; excoriated blood vessels central TM left and scarring 4-8 oclock right; bilateral allergic shiners and lower eyelids swollen 1-2+/4; bilateral  nasal turbinates clear discharge  Eyes: Pupils are equal, round, and reactive to light. Conjunctivae, EOM and lids are normal. Right eye exhibits no chemosis, no discharge, no exudate and no hordeolum. No foreign body present in the right eye. Left eye exhibits no chemosis, no discharge, no exudate and no hordeolum. No foreign body present in the left eye. Right conjunctiva is not injected. Right conjunctiva has no hemorrhage. Left conjunctiva is not injected. Left conjunctiva has no hemorrhage. No scleral icterus. Right eye exhibits normal extraocular motion and no nystagmus. Left eye exhibits normal extraocular motion and no nystagmus. Right pupil is round and reactive. Left pupil is round and reactive. Pupils are equal.  Neck: Trachea normal and normal range of motion. Neck supple. No tracheal tenderness, no spinous process tenderness and no muscular tenderness present. No neck rigidity. No tracheal deviation, no edema, no erythema and normal range of motion present. No thyroid mass and no thyromegaly present.  Cardiovascular: Normal rate, regular rhythm, S1 normal,  S2 normal, normal heart sounds and intact distal pulses. PMI is not displaced. Exam reveals no gallop and no friction rub.  No murmur heard. Pulmonary/Chest: Effort normal and breath sounds normal. No accessory muscle usage or stridor. No respiratory distress. She has no decreased breath sounds. She has no wheezes. She has no rhonchi. She has no rales. She exhibits no tenderness.  Spoke full sentences without difficulty; cough not observed in exam room  Abdominal: Soft. She exhibits no distension.  Musculoskeletal: Normal range of motion. She exhibits no edema or tenderness.       Right shoulder: Normal.       Left shoulder: Normal.       Right hip: Normal.       Left hip: Normal.       Right knee: Normal.       Left knee: Normal.       Cervical back: Normal.       Right hand: Normal.       Left hand: Normal.  Lymphadenopathy:        Head (right side): No submental, no submandibular, no tonsillar, no preauricular, no posterior auricular and no occipital adenopathy present.       Head (left side): No submental, no submandibular, no tonsillar, no preauricular, no posterior auricular and no occipital adenopathy present.    She has no cervical adenopathy.       Right cervical: No superficial cervical, no deep cervical and no posterior cervical adenopathy present.      Left cervical: No superficial cervical, no deep cervical and no posterior cervical adenopathy present.  Neurological: She is alert and oriented to person, place, and time. She has normal strength. She is not disoriented. She displays no atrophy and no tremor. No cranial nerve deficit or sensory deficit. She exhibits normal muscle tone. She displays no seizure activity. Coordination and gait normal. GCS eye subscore is 4. GCS verbal subscore is 5. GCS motor subscore is 6.  Skin: Skin is warm, dry and intact. No abrasion, no bruising, no burn, no ecchymosis, no laceration, no lesion, no petechiae and no rash noted. She is not diaphoretic. No cyanosis or erythema. No pallor. Nails show no clubbing.  Psychiatric: She has a normal mood and affect. Her speech is normal and behavior is normal. Judgment and thought content normal. Cognition and memory are normal.  Nursing note and vitals reviewed.    UC Treatments / Results  Labs (all labs ordered are listed, but only abnormal results are displayed) Labs Reviewed - No data to display  EKG None  Radiology No results found.  Procedures Procedures (including critical care time)  Medications Ordered in UC Medications - No data to display  Initial Impression / Assessment and Plan / UC Course  I have reviewed the triage vital signs and the nursing notes.  Pertinent labs & imaging results that were available during my care of the patient were reviewed by me and considered in my medical decision making (see chart for  details).      Final Clinical Impressions(s) / UC Diagnoses   Final diagnoses:  Eustachian tube dysfunction, bilateral   Discharge Instructions   None    ED Prescriptions    Medication Sig Dispense Auth. Provider   loratadine (CLARITIN) 10 MG tablet Take 1 tablet (10 mg total) by mouth daily. 30 tablet Constancia Geeting A, NP   sodium chloride (OCEAN) 0.65 % SOLN nasal spray Place 2 sprays into both nostrils every 2 (two) hours while  awake.  Dhruvan Gullion, Aura Fey, NP     Controlled Substance Prescriptions [place] Controlled Substance Registry consulted? Not Applicable   A-bilateral eustachian tube dysfunction, acute rhinosinusitis  P-Supportive treatment. Discussed that fluid in middle ear can take up to 30 days after ear infection treated to resolve.  Popping is common as fluid clears.  Need to control post nasal drip so eustachian tubes can drain into throat.  No evidence of invasive bacterial infection, non toxic and well hydrated.  This is most likely self limiting viral infection.  I do not see where any further testing or imaging is necessary at this time.   I will suggest supportive care, rest, good hygiene and encourage the patient to take adequate fluids.  The patient is to return to clinic or EMERGENCY ROOM if symptoms worsen or change significantly e.g. ear pain, fever, purulent discharge from ears or bleeding.  Exitcare handout on otitis media with effusion and eustachian tube dysfunction given to patient.  Patient and daughter verbalized agreement and understanding of treatment plan and had no further questions at this time.    Patient may use normal saline nasal spray 2 sprays each nostril q2h wa as needed. flonase 61mcg 1 spray each nostril BID at home.  Patient denied personal or family history of ENT cancer. antihistamine of choice claritin 10mg  po daily #30 RF0 electronic Rx to her pharmacy of choice.  Avoid triggers if possible.  Shower prior to bedtime if exposed to triggers.   If allergic dust/dust mites recommend mattress/pillow covers/encasements; washing linens, vacuuming, sweeping, dusting weekly. Exitcare handout on sinus rinse and allergic rhinitis given to patient  Call or return to clinic as needed if these symptoms worsen or fail to improve as anticipated.  Daughter and  Patient verbalized understanding of instructions, agreed with plan of care and had no further questions at this time.  P2:  Avoidance and hand washing.   Olen Cordial, NP 08/10/17 [phone]

## 2017-08-11 ENCOUNTER — Other Ambulatory Visit: Payer: Self-pay | Admitting: Family Medicine

## 2017-08-11 DIAGNOSIS — Z1231 Encounter for screening mammogram for malignant neoplasm of breast: Secondary | ICD-10-CM

## 2017-08-19 ENCOUNTER — Inpatient Hospital Stay: Admission: RE | Admit: 2017-08-19 | Payer: PRIVATE HEALTH INSURANCE | Source: Ambulatory Visit

## 2017-11-11 ENCOUNTER — Other Ambulatory Visit: Payer: Self-pay

## 2017-11-11 ENCOUNTER — Ambulatory Visit: Payer: Medicare Other

## 2017-11-11 ENCOUNTER — Ambulatory Visit
Admission: EM | Admit: 2017-11-11 | Discharge: 2017-11-11 | Disposition: A | Discharge status: Home / Self Care | Payer: Medicare Other | Attending: Emergency Medicine | Admitting: Emergency Medicine

## 2017-11-11 DIAGNOSIS — L03116 Cellulitis of left lower limb: Secondary | ICD-10-CM | POA: Insufficient documentation

## 2017-11-11 DIAGNOSIS — Z90710 Acquired absence of both cervix and uterus: Secondary | ICD-10-CM | POA: Insufficient documentation

## 2017-11-11 DIAGNOSIS — J45909 Unspecified asthma, uncomplicated: Secondary | ICD-10-CM | POA: Insufficient documentation

## 2017-11-11 DIAGNOSIS — W228XXA Striking against or struck by other objects, initial encounter: Secondary | ICD-10-CM | POA: Insufficient documentation

## 2017-11-11 DIAGNOSIS — M79662 Pain in left lower leg: Secondary | ICD-10-CM | POA: Diagnosis present

## 2017-11-11 DIAGNOSIS — Z23 Encounter for immunization: Secondary | ICD-10-CM | POA: Diagnosis not present

## 2017-11-11 DIAGNOSIS — Z7901 Long term (current) use of anticoagulants: Secondary | ICD-10-CM | POA: Insufficient documentation

## 2017-11-11 DIAGNOSIS — Z79899 Other long term (current) drug therapy: Secondary | ICD-10-CM | POA: Insufficient documentation

## 2017-11-11 DIAGNOSIS — I10 Essential (primary) hypertension: Secondary | ICD-10-CM | POA: Diagnosis not present

## 2017-11-11 DIAGNOSIS — Z8249 Family history of ischemic heart disease and other diseases of the circulatory system: Secondary | ICD-10-CM | POA: Insufficient documentation

## 2017-11-11 DIAGNOSIS — Z818 Family history of other mental and behavioral disorders: Secondary | ICD-10-CM | POA: Diagnosis not present

## 2017-11-11 MED ORDER — MUPIROCIN 2 % EX OINT: TOPICAL_OINTMENT | CUTANEOUS | 0 refills | Status: DC

## 2017-11-11 MED ORDER — TETANUS-DIPHTH-ACELL PERTUSSIS 5-2.5-18.5 LF-MCG/0.5 IM SUSP
0.5000 mL | Freq: Once | INTRAMUSCULAR | Status: AC
  Administered 2017-11-11: 0.5 mL via INTRAMUSCULAR

## 2017-11-11 MED ORDER — CEPHALEXIN 500 MG PO CAPS: 500.0000 mg | ORAL_CAPSULE | Freq: Three times a day (TID) | ORAL | 0 refills | Status: AC

## 2017-11-11 NOTE — ED Triage Notes (Signed)
Patient complains of left leg scrape from a ladder that fell in her garage 1 week ago. Patient states that area has started to be painful and red and she was concerned for infection.

## 2017-11-11 NOTE — ED Provider Notes (Addendum)
[facility] ____________________________________________  Time seen: Approximately 4:25 PM  I have reviewed the triage vital signs and the nursing notes.   HISTORY  Chief Complaint Wound Check  HPI Cathy Keith is a 72 y.o. female patient reports that 5 or 6 days ago she was in her garage and the metal ladder fell and hit her left anterior shin causing injury.  States that it removed a piece of skin.  States initially just cleaned it but continued to remain active.  States no pain or issues initially.  States over the last few days she has noticed gradual onset of redness around the break in skin with associated tenderness prompting evaluation.  Denies any drainage, swelling, proximal or distal leg complaints.  Denies paresthesias.  States his continue to remain very active.  Has been keeping clean with soap and water at home.  Unsure of last tetanus immunization.  States minimally tender at this time, more tenderness with direct palpation.  Denies recurrent skin infections.  Reports otherwise feels well and denies other aggravating alleviating factors. Denies recent sickness. Denies recent antibiotic use.  Denies other injuries.  Hortencia Pilar, MD: PCP    Past Medical History:  Diagnosis Date  . Asthma   . Hypertension     There are no active problems to display for this patient.   Past Surgical History:  Procedure Laterality Date  . ABDOMINAL HYSTERECTOMY       No current facility-administered medications for this encounter.   Current Outpatient Medications:  .  albuterol (VENTOLIN HFA) 108 (90 Base) MCG/ACT inhaler, Inhale into the lungs., Disp: , Rfl:  .  diltiazem (CARDIZEM CD) 120 MG 24 hr capsule, Take 120 mg by mouth daily., Disp: , Rfl:  .  ELIQUIS 5 MG TABS tablet, , Disp: , Rfl: 6 .  montelukast (SINGULAIR) 10 MG tablet, Take 10 mg by mouth at bedtime., Disp: , Rfl:  .  cephALEXin (KEFLEX) 500 MG capsule, Take 1 capsule (500 mg total) by mouth  3 (three) times daily for 7 days., Disp: 21 capsule, Rfl: 0 .  mupirocin ointment (BACTROBAN) 2 %, Apply two times a day for 7 days., Disp: 22 g, Rfl: 0  Allergies Patient has no known allergies.  Family History  Problem Relation Age of Onset  . Dementia Mother   . Heart disease Father   . Heart attack Father     Social History Social History   Tobacco Use  . Smoking status: Never Smoker  . Smokeless tobacco: Never Used  Substance Use Topics  . Alcohol use: Yes    Comment: occasional  . Drug use: No    Review of Systems Constitutional: No fever/chills ENT: No sore throat. Cardiovascular: Denies chest pain. Respiratory: Denies shortness of breath. Gastrointestinal: No abdominal pain.  Musculoskeletal: Negative for back pain. AS above.  Skin: As above.    ____________________________________________   PHYSICAL EXAM:  VITAL SIGNS: ED Triage Vitals  Enc Vitals Group     BP 11/11/17 1247 123/65     Pulse Rate 11/11/17 1247 68     Resp 11/11/17 1247 16     Temp 11/11/17 1247 98.9 F (37.2 C)     Temp Source 11/11/17 1247 Oral     SpO2 11/11/17 1247 99 %     Weight 11/11/17 1245 140 lb (63.5 kg)     Height 11/11/17 1245 5\' 5"  (1.651 m)     Head Circumference --      Peak Flow --  Pain Score 11/11/17 1245 2     Pain Loc --      Pain Edu? --      Excl. in [place]? --     Constitutional: Alert and oriented. Well appearing and in no acute distress. ENT      Head: Normocephalic and atraumatic. Cardiovascular: Normal rate, regular rhythm. Grossly normal heart sounds.  Good peripheral circulation. Respiratory: Normal respiratory effort without tachypnea nor retractions. Breath sounds are clear and equal bilaterally. No wheezes, rales, rhonchi. Gastrointestinal: Soft and nontender. No distention. Normal Bowel sounds. No CVA tenderness. Musculoskeletal: Steady gait.  Bilateral pedal pulses equal and easily palpated. Except: Left mid pretibial shin deep abrasion noted  approximately 1.5 cm in diameter with centered crusting eschar, no drainage, no fluctuance, mild to moderate immediate surrounding erythema without further surrounding erythema, mild to moderate direct tenderness, no further surrounding tenderness, no posterior calf tenderness, no proximal or distal tenderness or edema, normal distal sensation and capillary refill. No extremity edema noted.  Neurologic:  Normal speech and language. No gross focal neurologic deficits are appreciated. Speech is normal. No gait instability.  Skin:  Skin is warm, dry.  As above. Psychiatric: Mood and affect are normal. Speech and behavior are normal. Patient exhibits appropriate insight and judgment   ___________________________________________   LABS (all labs ordered are listed, but only abnormal results are displayed)  Labs Reviewed - No data to display  RADIOLOGY  Dg Tibia/fibula Left  Result Date: 11/11/2017 CLINICAL DATA:  Bladder fell off a garage and struck mid anterior shin 1 week ago, pain with red wound there now EXAM: LEFT TIBIA AND FIBULA - 2 VIEW COMPARISON:  None FINDINGS: Osseous demineralization. LEFT knee and LEFT ankle joint alignments normal. No acute fracture, dislocation, or bone destruction. No radiopaque foreign bodies. IMPRESSION: No acute osseous abnormalities. Electronically Signed   By: Lavonia Dana M.D.   On: 11/11/2017 14:43   ____________________________________________   PROCEDURES Procedures    INITIAL IMPRESSION / ASSESSMENT AND PLAN / ED COURSE  Pertinent labs & imaging results that were available during my care of the patient were reviewed by me and considered in my medical decision making (see chart for details).  Well-appearing patient.  No acute distress.  Presenting for evaluation of left pre-tib skin evaluation after injury that occurred 5 to 6 days ago.  Left tib-fib x-ray as above per radiologist, no acute osseous abnormalities.  Tenderness and redness is localized  only directly around skin pretibial, no other issues and noncircumferential.  Area consistent with secondary cellulitis.  Will treat with Keflex and topical Bactroban.  Tetanus immunization updated.  Encouraged supportive care and monitoring.Discussed indication, risks and benefits of medications with patient.  Discussed follow up with Primary care physician this week. Discussed follow up and return parameters including no resolution or any worsening concerns. Patient verbalized understanding and agreed to plan.   ____________________________________________   FINAL CLINICAL IMPRESSION(S) / ED DIAGNOSES  Final diagnoses:  Cellulitis of leg, left     ED Discharge Orders         Ordered    cephALEXin (KEFLEX) 500 MG capsule  3 times daily     11/11/17 1452    mupirocin ointment (BACTROBAN) 2 %     11/11/17 1452           Note: This dictation was prepared with Dragon dictation along with smaller phrase technology. Any transcriptional errors that result from this process are unintentional.  Marylene Land, NP 11/11/17 1630    Marylene Land, NP 11/11/17 [phone]

## 2017-11-11 NOTE — Discharge Instructions (Addendum)
Take medication as prescribed. Rest. Drink plenty of fluids.  Keep clean.  Continue to monitor.  Follow up with your primary care physician this week as needed. Return to Urgent care for new or worsening concerns.

## 2017-11-23 ENCOUNTER — Encounter: Payer: Self-pay | Admitting: Gynecology

## 2017-11-23 ENCOUNTER — Ambulatory Visit
Admission: EM | Admit: 2017-11-23 | Discharge: 2017-11-23 | Disposition: A | Discharge status: Home / Self Care | Payer: Medicare Other | Attending: Family Medicine | Admitting: Family Medicine

## 2017-11-23 DIAGNOSIS — T881XXA Other complications following immunization, not elsewhere classified, initial encounter: Secondary | ICD-10-CM

## 2017-11-23 DIAGNOSIS — M79602 Pain in left arm: Secondary | ICD-10-CM

## 2017-11-23 MED ORDER — TRAMADOL HCL 50 MG PO TABS: 50.0000 mg | ORAL_TABLET | Freq: Three times a day (TID) | ORAL | 0 refills | Status: DC | PRN

## 2017-11-23 NOTE — ED Provider Notes (Signed)
[facility]    CSN: [id number] Arrival date & time: 11/23/17  1055  History   Chief Complaint Chief Complaint  Patient presents with  . Arm Pain   HPI  72 year old female presents with left arm pain.  Patient states that she received a flu vaccine 1 week ago.  Was done at a local drugstore.  Patient states that since that time she has had left arm pain.  She states that she initially had swelling and warmth.  No redness.  She has contacted the pharmacy and they are contacting the pharmaceutical company.  Patient states that she has used ice and heat without resolution.  No medications tried.  She endorses moderate pain with range of motion.  No known relieving factors.  No other associated symptoms.  No other complaints.  PMH, Surgical Hx, Family Hx, Social History reviewed and updated as below.  Past Medical History:  Diagnosis Date  . Asthma   . Hypertension    Past Surgical History:  Procedure Laterality Date  . ABDOMINAL HYSTERECTOMY     OB History   None    Home Medications    Prior to Admission medications   Medication Sig Start Date End Date Taking? Authorizing Provider  ELIQUIS 5 MG TABS tablet  07/15/17  Yes [provider]  fluticasone furoate-vilanterol (BREO ELLIPTA) 100-25 MCG/INH AEPB INHALE 1 PUFF INTO THE LUNGS EVERY DAY 10/20/17  Yes [provider]  montelukast (SINGULAIR) 10 MG tablet Take 10 mg by mouth at bedtime.   Yes [provider]  mupirocin ointment (BACTROBAN) 2 % Apply two times a day for 7 days. 11/11/17  Yes Marylene Land, NP  traMADol (ULTRAM) 50 MG tablet Take 1 tablet (50 mg total) by mouth every 8 (eight) hours as needed. 11/23/17   Coral Spikes, DO    Family History Family History  Problem Relation Age of Onset  . Dementia Mother   . Heart disease Father   . Heart attack Father    Social History Social History   Tobacco Use  . Smoking status: Never Smoker  . Smokeless tobacco: Never Used    Substance Use Topics  . Alcohol use: Yes    Comment: occasional  . Drug use: No     Allergies   Patient has no known allergies.   Review of Systems Review of Systems  Constitutional: Negative.   Musculoskeletal:       Left arm pain and decreased ROM of arm/shoulder.   Physical Exam Triage Vital Signs ED Triage Vitals  Enc Vitals Group     BP 11/23/17 1131 (!) 151/64     Pulse Rate 11/23/17 1131 67     Resp 11/23/17 1131 16     Temp 11/23/17 1131 98.3 F (36.8 C)     Temp Source 11/23/17 1131 Oral     SpO2 11/23/17 1131 100 %     Weight 11/23/17 1127 140 lb (63.5 kg)     Height 11/23/17 1127 5\' 5"  (1.651 m)     Head Circumference --      Peak Flow --      Pain Score 11/23/17 1127 9     Pain Loc --      Pain Edu? --      Excl. in [place]? --    Updated Vital Signs BP (!) 151/64 (BP Location: Right Arm)   Pulse 67   Temp 98.3 F (36.8 C) (Oral)   Resp 16   Ht 5\' 5"  (  1.651 m)   Wt 63.5 kg   SpO2 100%   BMI 23.30 kg/m   Visual Acuity Right Eye Distance:   Left Eye Distance:   Bilateral Distance:    Right Eye Near:   Left Eye Near:    Bilateral Near:     Physical Exam  Constitutional: She appears well-developed. No distress.  Cardiovascular: Normal rate and regular rhythm.  Pulmonary/Chest: Effort normal and breath sounds normal. She has no wheezes. She has no rales.  Musculoskeletal:  Right upper arm -mild swelling noted.  Decreased range of motion secondary to pain.  Neurological: She is alert.  Psychiatric: She has a normal mood and affect. Her behavior is normal.  Nursing note and vitals reviewed.  UC Treatments / Results  Labs (all labs ordered are listed, but only abnormal results are displayed) Labs Reviewed - No data to display  EKG None  Radiology No results found.  Procedures Procedures (including critical care time)  Medications Ordered in UC Medications - No data to display  Initial Impression / Assessment and Plan / UC Course   I have reviewed the triage vital signs and the nursing notes.  Pertinent labs & imaging results that were available during my care of the patient were reviewed by me and considered in my medical decision making (see chart for details).    72 year old female presents with left upper arm pain.  Appears to be directly correlated with flu vaccination.  Local reaction/adverse reaction.  Treating pain with tramadol.  Not using anti-inflammatories secondary to Eliquis use.  Advised rest, ice.  Advised continued range of motion.  Final Clinical Impressions(s) / UC Diagnoses   Final diagnoses:  Left arm pain     Discharge Instructions     Rest, ice, heat.  Use the medication as needed.  Take care  Dr. Lacinda Axon     ED Prescriptions    Medication Sig Dispense Auth. Provider   traMADol (ULTRAM) 50 MG tablet Take 1 tablet (50 mg total) by mouth every 8 (eight) hours as needed. 15 tablet Coral Spikes, DO     Controlled Substance Prescriptions  Controlled Substance Registry consulted? Not Applicable   Coral Spikes, DO 11/23/17 1246

## 2017-11-23 NOTE — Discharge Instructions (Signed)
Rest, ice, heat.  Use the medication as needed.  Take care  Dr. Lacinda Axon

## 2017-11-23 NOTE — ED Triage Notes (Signed)
Per patient x 1 week ago had her FLU shot done at [facility] drug store. Per patient receive shot in her left arm. Patient stated with left arm pain.

## 2017-12-03 ENCOUNTER — Other Ambulatory Visit: Payer: Self-pay | Admitting: Internal Medicine

## 2017-12-03 DIAGNOSIS — R310 Gross hematuria: Secondary | ICD-10-CM

## 2017-12-08 ENCOUNTER — Ambulatory Visit: Admission: RE | Admit: 2017-12-08 | Payer: Medicare Other | Source: Ambulatory Visit

## 2017-12-10 ENCOUNTER — Ambulatory Visit
Admission: RE | Admit: 2017-12-10 | Discharge: 2017-12-10 | Disposition: A | Discharge status: Home / Self Care | Payer: Medicare Other | Source: Ambulatory Visit | Attending: Internal Medicine | Admitting: Internal Medicine

## 2017-12-10 DIAGNOSIS — R310 Gross hematuria: Secondary | ICD-10-CM

## 2017-12-18 ENCOUNTER — Other Ambulatory Visit: Payer: Self-pay | Admitting: Family Medicine

## 2017-12-18 DIAGNOSIS — R310 Gross hematuria: Secondary | ICD-10-CM

## 2017-12-22 ENCOUNTER — Ambulatory Visit
Admission: RE | Admit: 2017-12-22 | Discharge: 2017-12-22 | Disposition: A | Discharge status: Home / Self Care | Payer: Medicare Other | Source: Ambulatory Visit | Attending: Family Medicine | Admitting: Family Medicine

## 2017-12-22 DIAGNOSIS — R310 Gross hematuria: Secondary | ICD-10-CM | POA: Insufficient documentation

## 2018-01-01 ENCOUNTER — Ambulatory Visit: Payer: Medicare Other | Admitting: Urology

## 2018-01-12 ENCOUNTER — Other Ambulatory Visit: Payer: Self-pay | Admitting: Family Medicine

## 2018-01-12 DIAGNOSIS — M25512 Pain in left shoulder: Principal | ICD-10-CM

## 2018-01-12 DIAGNOSIS — Z780 Asymptomatic menopausal state: Secondary | ICD-10-CM

## 2018-01-12 DIAGNOSIS — G8929 Other chronic pain: Secondary | ICD-10-CM

## 2018-01-19 ENCOUNTER — Ambulatory Visit: Payer: Medicare Other

## 2018-01-29 ENCOUNTER — Ambulatory Visit: Payer: Medicare Other

## 2018-02-02 ENCOUNTER — Ambulatory Visit: Payer: Medicare Other

## 2018-02-05 ENCOUNTER — Ambulatory Visit
Admission: RE | Admit: 2018-02-05 | Discharge: 2018-02-05 | Disposition: A | Discharge status: Home / Self Care | Payer: Medicare Other | Source: Ambulatory Visit | Attending: Family Medicine | Admitting: Family Medicine

## 2018-02-05 DIAGNOSIS — M75122 Complete rotator cuff tear or rupture of left shoulder, not specified as traumatic: Secondary | ICD-10-CM | POA: Insufficient documentation

## 2018-02-05 DIAGNOSIS — R609 Edema, unspecified: Secondary | ICD-10-CM | POA: Insufficient documentation

## 2018-02-05 DIAGNOSIS — G8929 Other chronic pain: Secondary | ICD-10-CM

## 2018-02-05 DIAGNOSIS — M25512 Pain in left shoulder: Secondary | ICD-10-CM | POA: Insufficient documentation

## 2018-04-02 ENCOUNTER — Ambulatory Visit
Admission: EM | Admit: 2018-04-02 | Discharge: 2018-04-02 | Disposition: A | Discharge status: Home / Self Care | Payer: Medicare Other | Attending: Family Medicine | Admitting: Family Medicine

## 2018-04-02 ENCOUNTER — Other Ambulatory Visit: Payer: Self-pay

## 2018-04-02 ENCOUNTER — Encounter: Payer: Self-pay | Admitting: Emergency Medicine

## 2018-04-02 DIAGNOSIS — J069 Acute upper respiratory infection, unspecified: Secondary | ICD-10-CM | POA: Diagnosis not present

## 2018-04-02 MED ORDER — BENZONATATE 200 MG PO CAPS: ORAL_CAPSULE | ORAL | 0 refills | Status: AC

## 2018-04-02 MED ORDER — HYDROCOD POLST-CPM POLST ER 10-8 MG/5ML PO SUER: 5.0000 mL | Freq: Two times a day (BID) | ORAL | 0 refills | Status: AC

## 2018-04-02 NOTE — ED Triage Notes (Signed)
Patient c/o cough and congestion that started 5 days ago. Patient denies fever. Patient has been taking Delsym for the cough.

## 2018-04-02 NOTE — ED Provider Notes (Signed)
[facility]    CSN: [id number] Arrival date & time: 04/02/18  1033     History   Chief Complaint Chief Complaint  Patient presents with  . Cough  . Nasal Congestion    HPI Cathy Keith is a 73 y.o. female.   HPI -year-old female presents with cough and congestion that started about 5 days ago.  Also had postnasal drip and sinus drainage.  Any fever or chills.  Been taking Delsym for the cough.  Non-smoker.  She is tries to remain very active but towards the afternoon as it progresses she is having more and more symptoms particular coughing                Past Medical History:  Diagnosis Date  . Asthma   . Hypertension     There are no active problems to display for this patient.   Past Surgical History:  Procedure Laterality Date  . ABDOMINAL HYSTERECTOMY      OB History   No obstetric history on file.      Home Medications    Prior to Admission medications   Medication Sig Start Date End Date Taking? Authorizing Provider  ELIQUIS 5 MG TABS tablet  07/15/17  Yes [provider]  fluticasone furoate-vilanterol (BREO ELLIPTA) 100-25 MCG/INH AEPB INHALE 1 PUFF INTO THE LUNGS EVERY DAY 10/20/17  Yes [provider]  montelukast (SINGULAIR) 10 MG tablet Take 10 mg by mouth at bedtime.   Yes [provider]  benzonatate (TESSALON) 200 MG capsule Take one cap TID PRN cough 04/02/18   Lorin Picket, PA-C  chlorpheniramine-HYDROcodone [facility]) 10-8 MG/5ML SUER Take 5 mLs by mouth 2 (two) times daily. 04/02/18   Lorin Picket, PA-C    Family History Family History  Problem Relation Age of Onset  . Dementia Mother   . Heart disease Father   . Heart attack Father     Social History Social History   Tobacco Use  . Smoking status: Never Smoker  . Smokeless tobacco: Never Used  Substance Use Topics  . Alcohol use: Not Currently    Comment: occasional  . Drug use: No      Allergies   Patient has no known allergies.   Review of Systems Review of Systems  Constitutional: Negative for activity change, appetite change, chills, fatigue and fever.  HENT: Positive for congestion, postnasal drip and sinus pressure.   Respiratory: Positive for cough.   All other systems reviewed and are negative.    Physical Exam Triage Vital Signs ED Triage Vitals  Enc Vitals Group     BP 04/02/18 1049 109/78     Pulse Rate 04/02/18 1049 73     Resp 04/02/18 1049 18     Temp 04/02/18 1049 98.2 F (36.8 C)     Temp Source 04/02/18 1049 Oral     SpO2 04/02/18 1049 100 %     Weight 04/02/18 1048 142 lb (64.4 kg)     Height 04/02/18 1048 5\' 5"  (0.938 m)     Head Circumference --      Peak Flow --      Pain Score 04/02/18 1048 0     Pain Loc --      Pain Edu? --      Excl. in [place]? --    No data found.  Updated Vital Signs BP 109/78 (BP Location: Right Arm)   Pulse 73   Temp 98.2 F (36.8 C) (  Oral)   Resp 18   Ht 5\' 5"  (1.651 m)   Wt 142 lb (64.4 kg)   SpO2 100%   BMI 23.63 kg/m   Visual Acuity Right Eye Distance:   Left Eye Distance:   Bilateral Distance:    Right Eye Near:   Left Eye Near:    Bilateral Near:     Physical Exam Vitals signs and nursing note reviewed.  Constitutional:      General: She is not in acute distress.    Appearance: Normal appearance. She is normal weight. She is not ill-appearing, toxic-appearing or diaphoretic.  HENT:     Head: Normocephalic.     Right Ear: Tympanic membrane and ear canal normal.     Left Ear: Tympanic membrane and ear canal normal.     Nose: Nose normal.     Mouth/Throat:     Mouth: Mucous membranes are moist.     Pharynx: No oropharyngeal exudate or posterior oropharyngeal erythema.  Eyes:     General:        Right eye: No discharge.        Left eye: No discharge.     Conjunctiva/sclera: Conjunctivae normal.     Pupils: Pupils are equal, round, and reactive to light.  Neck:      Musculoskeletal: Normal range of motion.  Pulmonary:     Effort: Pulmonary effort is normal.     Breath sounds: Normal breath sounds.  Musculoskeletal: Normal range of motion.  Lymphadenopathy:     Cervical: No cervical adenopathy.  Skin:    General: Skin is warm and dry.  Neurological:     General: No focal deficit present.     Mental Status: She is alert and oriented to person, place, and time.  Psychiatric:        Mood and Affect: Mood normal.        Behavior: Behavior normal.        Thought Content: Thought content normal.        Judgment: Judgment normal.      UC Treatments / Results  Labs (all labs ordered are listed, but only abnormal results are displayed) Labs Reviewed - No data to display  EKG None  Radiology No results found.  Procedures Procedures (including critical care time)  Medications Ordered in UC Medications - No data to display  Initial Impression / Assessment and Plan / UC Course  I have reviewed the triage vital signs and the nursing notes.  Pertinent labs & imaging results that were available during my care of the patient were reviewed by me and considered in my medical decision making (see chart for details).   Patient has an upper respiratory infection.  Told her this is a virus does not require antibiotics.  We will treat her cough symptoms with a cough suppressants.  I have also recommended Flonase nasal spray for her nasal congestion and drainage.  High fevers or notices any worsening she may return to our clinic or follow-up with the primary care physician   Final Clinical Impressions(s) / UC Diagnoses   Final diagnoses:  Acute upper respiratory infection   Discharge Instructions   None    ED Prescriptions    Medication Sig Dispense Auth. Provider   benzonatate (TESSALON) 200 MG capsule Take one cap TID PRN cough 30 capsule Lorin Picket, PA-C   chlorpheniramine-HYDROcodone (TUSSIONEX PENNKINETIC ER) 10-8 MG/5ML SUER Take 5  mLs by mouth 2 (two) times daily. 60 mL Lorin Picket,  PA-C     Controlled Substance Prescriptions [place] Controlled Substance Registry consulted? Not Applicable   Lorin Picket, PA-C 04/02/18 1207

## 2018-08-23 ENCOUNTER — Ambulatory Visit: Payer: Medicare Other

## 2018-08-23 ENCOUNTER — Ambulatory Visit
Admission: EM | Admit: 2018-08-23 | Discharge: 2018-08-23 | Disposition: A | Discharge status: Home / Self Care | Payer: Medicare Other | Attending: Family Medicine | Admitting: Family Medicine

## 2018-08-23 ENCOUNTER — Encounter: Payer: Self-pay | Admitting: Emergency Medicine

## 2018-08-23 ENCOUNTER — Other Ambulatory Visit: Payer: Self-pay

## 2018-08-23 DIAGNOSIS — W010XXA Fall on same level from slipping, tripping and stumbling without subsequent striking against object, initial encounter: Secondary | ICD-10-CM

## 2018-08-23 DIAGNOSIS — S52502A Unspecified fracture of the lower end of left radius, initial encounter for closed fracture: Secondary | ICD-10-CM | POA: Insufficient documentation

## 2018-08-23 MED ORDER — HYDROCODONE-ACETAMINOPHEN 5-325 MG PO TABS: ORAL_TABLET | ORAL | 0 refills | Status: DC

## 2018-08-23 MED ORDER — ACETAMINOPHEN 500 MG PO TABS
1000.0000 mg | ORAL_TABLET | Freq: Once | ORAL | Status: AC
  Administered 2018-08-23: 1000 mg via ORAL

## 2018-08-23 NOTE — Discharge Instructions (Signed)
Follow up with orthopedist this week

## 2018-08-23 NOTE — ED Provider Notes (Signed)
[facility]    CSN: [id number] Arrival date & time: 08/23/18  9211     History   Chief Complaint Chief Complaint  Patient presents with  . Hand Injury    HPI Cathy Keith is a 73 y.o. female.   73 yo female with a c/o left wrist and forearm pain since falling yesterday. States she [place] when she slipped and fell landing on her wrist.   The history is provided by the patient.  Hand Injury    Past Medical History:  Diagnosis Date  . Asthma   . Hypertension     There are no active problems to display for this patient.   Past Surgical History:  Procedure Laterality Date  . ABDOMINAL HYSTERECTOMY      OB History   No obstetric history on file.      Home Medications    Prior to Admission medications   Medication Sig Start Date End Date Taking? Authorizing Provider  ELIQUIS 5 MG TABS tablet  07/15/17  Yes [provider]  fluticasone furoate-vilanterol (BREO ELLIPTA) 100-25 MCG/INH AEPB INHALE 1 PUFF INTO THE LUNGS EVERY DAY 10/20/17  Yes [provider]  montelukast (SINGULAIR) 10 MG tablet Take 10 mg by mouth at bedtime.   Yes [provider]  benzonatate (TESSALON) 200 MG capsule Take one cap TID PRN cough 04/02/18   Lorin Picket, PA-C  chlorpheniramine-HYDROcodone [facility] ER) 10-8 MG/5ML SUER Take 5 mLs by mouth 2 (two) times daily. 04/02/18   Lorin Picket, PA-C  HYDROcodone-acetaminophen (NORCO/VICODIN) 5-325 MG tablet 1-2 tabs po qd prn 08/23/18   Norval Gable, MD    Family History Family History  Problem Relation Age of Onset  . Dementia Mother   . Heart disease Father   . Heart attack Father     Social History Social History   Tobacco Use  . Smoking status: Never Smoker  . Smokeless tobacco: Never Used  Substance Use Topics  . Alcohol use: Not Currently    Comment: occasional  . Drug use: No     Allergies   Patient has no known allergies.   Review of  Systems Review of Systems   Physical Exam Triage Vital Signs ED Triage Vitals  Enc Vitals Group     BP 08/23/18 0944 140/65     Pulse Rate 08/23/18 0944 68     Resp 08/23/18 0944 18     Temp 08/23/18 0944 98.3 F (36.8 C)     Temp Source 08/23/18 0944 Oral     SpO2 08/23/18 0944 100 %     Weight 08/23/18 0940 134 lb (60.8 kg)     Height 08/23/18 0940 5\' 5"  (1.651 m)     Head Circumference --      Peak Flow --      Pain Score 08/23/18 0939 10     Pain Loc --      Pain Edu? --      Excl. in [place]? --    No data found.  Updated Vital Signs BP 140/65 (BP Location: Right Arm)   Pulse 68   Temp 98.3 F (36.8 C) (Oral)   Resp 18   Ht 5\' 5"  (1.651 m)   Wt 60.8 kg   SpO2 100%   BMI 22.30 kg/m   Visual Acuity Right Eye Distance:   Left Eye Distance:   Bilateral Distance:    Right Eye Near:   Left Eye Near:  Bilateral Near:     Physical Exam Vitals signs and nursing note reviewed.  Constitutional:      General: She is not in acute distress.    Appearance: She is not toxic-appearing or diaphoretic.  Musculoskeletal:     Left wrist: She exhibits decreased range of motion, tenderness, bony tenderness and swelling. She exhibits no crepitus, no deformity and no laceration.     Comments: Left upper extremity/hand neurovascularly intact  Neurological:     Mental Status: She is alert.      UC Treatments / Results  Labs (all labs ordered are listed, but only abnormal results are displayed) Labs Reviewed - No data to display  EKG None  Radiology Dg Forearm Left  Result Date: 08/23/2018 CLINICAL DATA:  Injury EXAM: LEFT FOREARM - 2 VIEW COMPARISON:  None. FINDINGS: There is an acute transverse fracture across the distal radius metaphysis. Slight dorsal tilt of the distal fragment. Soft tissue swelling is associated. Ulna is grossly intact. IMPRESSION: Acute distal radius fracture as described Electronically Signed   By: Marybelle Killings M.D.   On: 08/23/2018 10:26   Dg  Wrist Complete Left  Result Date: 08/23/2018 CLINICAL DATA:  Wrist pain and swelling post fall. EXAM: LEFT WRIST - COMPLETE 3+ VIEW COMPARISON:  None. FINDINGS: The bones are demineralized. There is an acute, mildly impacted and mildly displaced intra-articular fracture of the distal radius. Fracture extends to the distal articular surface near the sigmoid notch. The distal ulna appears intact. The carpal bones are intact. Mild degenerative changes are present in the radial aspect of the wrist. There is moderate soft tissue swelling in the distal forearm. IMPRESSION: Mildly displaced intra-articular fracture of the distal radius. No evidence of ulnar or carpal bone fracture. Electronically Signed   By: Richardean Sale M.D.   On: 08/23/2018 10:27    Procedures Procedures (including critical care time)  Medications Ordered in UC Medications  acetaminophen (TYLENOL) tablet 1,000 mg (1,000 mg Oral Given 08/23/18 1026)    Initial Impression / Assessment and Plan / UC Course  I have reviewed the triage vital signs and the nursing notes.  Pertinent labs & imaging results that were available during my care of the patient were reviewed by me and considered in my medical decision making (see chart for details).      Final Clinical Impressions(s) / UC Diagnoses   Final diagnoses:  Closed fracture of distal end of left radius, unspecified fracture morphology, initial encounter     Discharge Instructions     Follow up with orthopedist this week    ED Prescriptions    Medication Sig Dispense Auth. Provider   HYDROcodone-acetaminophen (NORCO/VICODIN) 5-325 MG tablet 1-2 tabs po qd prn 6 tablet Norval Gable, MD      1. x-ray results and diagnosis reviewed with patient 2. Immobilized with a sugar tong splint and sling 3. rx as per orders above; reviewed possible side effects, interactions, risks and benefits  4. Recommend supportive treatment with otc tylenol prn, elevation 5. Follow-up  with orthopedist this week (will be seen at [facility] clinic orthopedics in 2 days)  Controlled Substance Prescriptions [place] Controlled Substance Registry consulted? Not Applicable   Norval Gable, MD 08/23/18 1208

## 2018-08-23 NOTE — ED Triage Notes (Signed)
Patient states she fell yesterday and landed on her left hand.  Patient states her hand is very painful and swollen

## 2019-01-08 ENCOUNTER — Encounter: Payer: Self-pay | Admitting: Emergency Medicine

## 2019-01-08 ENCOUNTER — Other Ambulatory Visit: Payer: Self-pay

## 2019-01-08 ENCOUNTER — Ambulatory Visit
Admission: EM | Admit: 2019-01-08 | Discharge: 2019-01-08 | Disposition: A | Discharge status: Home / Self Care | Payer: Medicare Other | Attending: Family Medicine | Admitting: Family Medicine

## 2019-01-08 DIAGNOSIS — N390 Urinary tract infection, site not specified: Secondary | ICD-10-CM | POA: Insufficient documentation

## 2019-01-08 DIAGNOSIS — R319 Hematuria, unspecified: Secondary | ICD-10-CM | POA: Diagnosis not present

## 2019-01-08 LAB — URINALYSIS, COMPLETE (UACMP) WITH MICROSCOPIC
Bilirubin Urine: NEGATIVE
Glucose, UA: NEGATIVE mg/dL
Ketones, ur: NEGATIVE mg/dL
Nitrite: POSITIVE — AB
RBC / HPF: >50 RBC/hpf (ref 0–5)
Specific Gravity, Urine: 1.015 (ref 1.005–1.030)
WBC, UA: >50 WBC/hpf (ref 0–5)
pH: 5 (ref 5.0–8.0)

## 2019-01-08 MED ORDER — CEPHALEXIN 500 MG PO CAPS: 500.0000 mg | ORAL_CAPSULE | Freq: Two times a day (BID) | ORAL | 0 refills | Status: AC

## 2019-01-08 NOTE — Discharge Instructions (Addendum)
Take medication as prescribed. Rest. Drink plenty of fluids.  ° °Follow up with your primary care physician this week as needed. Return to Urgent care for new or worsening concerns.  ° °

## 2019-01-08 NOTE — ED Provider Notes (Signed)
[facility] ____________________________________________  Time seen: Approximately 11:06 AM  I have reviewed the triage vital signs and the nursing notes.   HISTORY  Chief Complaint Dysuria   HPI Cathy Keith is a 73 y.o. female presenting for evaluation of 1 week of dysuria.  States some discomfort with urination as well as urinary pressure.  Denies abdominal pain, back pain, fevers, vomiting or diarrhea.  Continues eat and drink well.  No recent sickness or recent urinary infection.  Reports otherwise doing well.  Denies renal insufficiency.  Hortencia Pilar, MD: PCP   Past Medical History:  Diagnosis Date  . Asthma   . Hypertension     There are no active problems to display for this patient.   Past Surgical History:  Procedure Laterality Date  . ABDOMINAL HYSTERECTOMY       No current facility-administered medications for this encounter.   Current Outpatient Medications:  .  ELIQUIS 5 MG TABS tablet, , Disp: , Rfl: 6 .  fluticasone furoate-vilanterol (BREO ELLIPTA) 100-25 MCG/INH AEPB, INHALE 1 PUFF INTO THE LUNGS EVERY DAY, Disp: , Rfl:  .  montelukast (SINGULAIR) 10 MG tablet, Take 10 mg by mouth at bedtime., Disp: , Rfl:  .  benzonatate (TESSALON) 200 MG capsule, Take one cap TID PRN cough, Disp: 30 capsule, Rfl: 0 .  cephALEXin (KEFLEX) 500 MG capsule, Take 1 capsule (500 mg total) by mouth 2 (two) times daily for 7 days., Disp: 14 capsule, Rfl: 0 .  chlorpheniramine-HYDROcodone (TUSSIONEX PENNKINETIC ER) 10-8 MG/5ML SUER, Take 5 mLs by mouth 2 (two) times daily., Disp: 60 mL, Rfl: 0 .  HYDROcodone-acetaminophen (NORCO/VICODIN) 5-325 MG tablet, 1-2 tabs po qd prn, Disp: 6 tablet, Rfl: 0  Allergies Patient has no known allergies.  Family History  Problem Relation Age of Onset  . Dementia Mother   . Heart disease Father   . Heart attack Father     Social History Social History   Tobacco Use  . Smoking status: Never Smoker  .  Smokeless tobacco: Never Used  Substance Use Topics  . Alcohol use: Not Currently    Comment: occasional  . Drug use: No    Review of Systems Constitutional: No fever ENT: No sore throat. Cardiovascular: Denies chest pain. Respiratory: Denies shortness of breath. Gastrointestinal: No abdominal pain.  No nausea, no vomiting.  No diarrhea.   Genitourinary: Positive for dysuria. Musculoskeletal: Negative for back pain. Skin: Negative for rash.   ____________________________________________   PHYSICAL EXAM:  VITAL SIGNS: ED Triage Vitals  Enc Vitals Group     BP 01/08/19 1021 131/60     Pulse Rate 01/08/19 1021 82     Resp 01/08/19 1021 14     Temp 01/08/19 1021 99.1 F (37.3 C)     Temp Source 01/08/19 1021 Oral     SpO2 01/08/19 1021 98 %     Weight 01/08/19 1018 134 lb 0.6 oz (60.8 kg)     Height 01/08/19 1018 5\' 5"  (1.651 m)     Head Circumference --      Peak Flow --      Pain Score 01/08/19 1017 3     Pain Loc --      Pain Edu? --      Excl. in [place]? --     Constitutional: Alert and oriented. Well appearing and in no acute distress. Eyes: Conjunctivae are normal.  ENT      Head: Normocephalic and atraumatic. Cardiovascular: Normal rate, regular rhythm. Grossly  normal heart sounds.  Good peripheral circulation. Respiratory: Normal respiratory effort without tachypnea nor retractions. Breath sounds are clear and equal bilaterally. No wheezes, rales, rhonchi. Gastrointestinal: Soft and nontender.  No CVA tenderness. Musculoskeletal:  Steady gait.  Neurologic:  Normal speech and language.  Skin:  Skin is warm, dry and intact. No rash noted. Psychiatric: Mood and affect are normal. Speech and behavior are normal. Patient exhibits appropriate insight and judgment   ___________________________________________   LABS (all labs ordered are listed, but only abnormal results are displayed)  Labs Reviewed  URINALYSIS, COMPLETE (UACMP) WITH MICROSCOPIC - Abnormal;  Notable for the following components:      Result Value   Color, Urine AMBER (*)    APPearance CLOUDY (*)    Hgb urine dipstick MODERATE (*)    Protein, ur TRACE (*)    Nitrite POSITIVE (*)    Leukocytes,Ua MODERATE (*)    Bacteria, UA MANY (*)    All other components within normal limits  URINE CULTURE   ____________________________________________    PROCEDURES Procedures    INITIAL IMPRESSION / ASSESSMENT AND PLAN / ED COURSE  Pertinent labs & imaging results that were available during my care of the patient were reviewed by me and considered in my medical decision making (see chart for details).  Well-appearing patient.  No acute distress.  Urinalysis reviewed, UTI.  Will treat with Keflex.  Encourage rest, fluids, supportive care.Discussed indication, risks and benefits of medications with patient.  Discussed follow up with Primary care physician this week. Discussed follow up and return parameters including no resolution or any worsening concerns. Patient verbalized understanding and agreed to plan.   ____________________________________________   FINAL CLINICAL IMPRESSION(S) / ED DIAGNOSES  Final diagnoses:  Urinary tract infection with hematuria, site unspecified     ED Discharge Orders         Ordered    cephALEXin (KEFLEX) 500 MG capsule  2 times daily     01/08/19 1058           Note: This dictation was prepared with Dragon dictation along with smaller phrase technology. Any transcriptional errors that result from this process are unintentional.         Marylene Land, NP 01/08/19 1108

## 2019-01-08 NOTE — ED Triage Notes (Signed)
Patient c/o burning when urinating for a week.  Patient denies fever or chills.

## 2019-01-11 ENCOUNTER — Telehealth (HOSPITAL_COMMUNITY): Payer: Self-pay | Admitting: Emergency Medicine

## 2019-01-11 LAB — URINE CULTURE: Culture: >=100000 — AB

## 2019-01-11 NOTE — Telephone Encounter (Signed)
Urine culture was positive for e coli and was given keflex  at urgent care visit. Attempted to reach patient. No answer at this time.   

## 2019-01-15 ENCOUNTER — Other Ambulatory Visit: Payer: Self-pay

## 2019-01-15 ENCOUNTER — Encounter: Payer: Self-pay | Admitting: Emergency Medicine

## 2019-01-15 ENCOUNTER — Ambulatory Visit
Admission: EM | Admit: 2019-01-15 | Discharge: 2019-01-15 | Disposition: A | Discharge status: Home / Self Care | Payer: Medicare Other | Attending: Emergency Medicine | Admitting: Emergency Medicine

## 2019-01-15 DIAGNOSIS — R42 Dizziness and giddiness: Secondary | ICD-10-CM

## 2019-01-15 MED ORDER — FLUTICASONE PROPIONATE 50 MCG/ACT NA SUSP: 1.0000 | Freq: Two times a day (BID) | NASAL | 2 refills | Status: AC

## 2019-01-15 MED ORDER — MECLIZINE HCL 12.5 MG PO TABS: 12.5000 mg | ORAL_TABLET | Freq: Three times a day (TID) | ORAL | 0 refills | Status: DC | PRN

## 2019-01-15 NOTE — ED Triage Notes (Signed)
Patient c/o dizziness that started yesterday.  Patient states that it feels like she is spinning.  Patient states that movement and turning her head makes it worse.

## 2019-01-15 NOTE — ED Provider Notes (Signed)
[facility] - [place], [place]   Name: Cathy Keith DOB: Aug 04, 1945 MRN: [id number] CSN: [id number] PCP: Hortencia Pilar, MD  Arrival date and time:  01/15/19 1031  Chief Complaint:  Dizziness   NOTE: Prior to seeing the patient today, I have reviewed the triage nursing documentation and vital signs. Clinical staff has updated patient's PMH/PSHx, current medication list, and drug allergies/intolerances to ensure comprehensive history available to assist in medical decision making.   History:   HPI: Cathy Keith is a 73 y.o. female who presents today with complaints of dizziness. Symptoms started yesterday. She doesn't have any feelings of the room or her person spinning, but she does feel unstable when walking at time. She has had similar occurences due to her 'issues' with her right inner ear, but none recently. Denies headaches, blurred vision, nausea or fever.    Past Medical History:  Diagnosis Date  . Asthma   . Hypertension     Past Surgical History:  Procedure Laterality Date  . ABDOMINAL HYSTERECTOMY      Family History  Problem Relation Age of Onset  . Dementia Mother   . Heart disease Father   . Heart attack Father     Social History   Tobacco Use  . Smoking status: Never Smoker  . Smokeless tobacco: Never Used  Substance Use Topics  . Alcohol use: Not Currently    Comment: occasional  . Drug use: No    There are no active problems to display for this patient.   Home Medications:    Current Meds  Medication Sig  . diltiazem (CARTIA XT) 180 MG 24 hr capsule TAKE 1 CAPSULE(180 MG) BY MOUTH EVERY DAY  . ELIQUIS 5 MG TABS tablet   . montelukast (SINGULAIR) 10 MG tablet Take 10 mg by mouth at bedtime.    Allergies:   Patient has no known allergies.  Review of Systems (ROS): Review of Systems  Constitutional: Positive for fatigue. Negative for fever.  HENT: Negative for ear discharge, ear pain and hearing loss.   Eyes:  Negative for pain, discharge and visual disturbance.  Respiratory: Negative for shortness of breath.   Neurological: Positive for light-headedness. Negative for dizziness, syncope, speech difficulty, weakness and headaches.     Vital Signs: Today's Vitals   01/15/19 1039 01/15/19 1042 01/15/19 1110  BP:  131/61   Pulse:  70   Resp:  14   Temp:  98.6 F (37 C)   TempSrc:  Oral   SpO2:  99%   Weight: 125 lb (56.7 kg)    Height: 5\' 5"  (1.651 m)    PainSc: 0-No pain  0-No pain    Physical Exam: Physical Exam Vitals signs and nursing note reviewed.  Constitutional:      Appearance: Normal appearance.  HENT:     Head: Normocephalic.     Right Ear: No decreased hearing noted. No drainage. A middle ear effusion is present. Tympanic membrane is bulging. Tympanic membrane is not injected.     Left Ear: Tympanic membrane and ear canal normal.     Nose: Nose normal.     Right Sinus: No maxillary sinus tenderness or frontal sinus tenderness.     Left Sinus: No maxillary sinus tenderness or frontal sinus tenderness.  Eyes:     General: Vision grossly intact.     Extraocular Movements: Extraocular movements intact.     Right eye: No nystagmus.     Left eye: No nystagmus.  Cardiovascular:  Rate and Rhythm: Normal rate and regular rhythm.  Pulmonary:     Effort: Pulmonary effort is normal.     Breath sounds: Normal breath sounds and air entry.  Neurological:     Mental Status: She is alert.      Urgent Care Treatments / Results:   LABS: PLEASE NOTE: all labs that were ordered this encounter are listed, however only abnormal results are displayed. Labs Reviewed - No data to display  EKG: -None  RADIOLOGY: No results found.  PROCEDURES: Procedures  MEDICATIONS RECEIVED THIS VISIT: Medications - No data to display  PERTINENT CLINICAL COURSE NOTES/UPDATES:   Initial Impression / Assessment and Plan / Urgent Care Course:  Pertinent labs & imaging results that were  available during my care of the patient were personally reviewed by me and considered in my medical decision making (see lab/imaging section of note for values and interpretations).  Cathy Keith is a 73 y.o. female who presents to [facility] today with complaints of dizziness, diagnosed with the same, and treated as such with the medications below. NP and patient reviewed discharge instructions below during visit.   Patient is well appearing overall in clinic today. She does not appear to be in any acute distress. Presenting symptoms (see HPI) and exam as documented above.   I have reviewed the follow up and strict return precautions for any new or worsening symptoms. Patient is aware of symptoms that would be deemed urgent/emergent, and would thus require further evaluation either here or in the emergency department. At the time of discharge, she verbalized understanding and consent with the discharge plan as it was reviewed with her. All questions were fielded by provider and/or clinic staff prior to patient discharge.    Final Clinical Impressions / Urgent Care Diagnoses:   Final diagnoses:  Dizziness    New Prescriptions:  [place] Controlled Substance Registry consulted? Not Applicable  Meds ordered this encounter  Medications  . meclizine (ANTIVERT) 12.5 MG tablet    Sig: Take 1 tablet (12.5 mg total) by mouth 3 (three) times daily as needed for dizziness.    Dispense:  30 tablet    Refill:  0  . fluticasone (FLONASE) 50 MCG/ACT nasal spray    Sig: Place 1 spray into both nostrils 2 (two) times daily.    Dispense:  11.1 mL    Refill:  2      Discharge Instructions     You are being treated for dizziness/vertigo.   Take you medications as prescribed. Start the meclizine tonight. If you get sleepy, only take it at night time.   Add over-the-counter Allegra to you daily medications.   Follow-up with an ENT if your symptoms don't get better.     Recommended  Follow up Care:  Patient encouraged to follow up with the following provider within the specified time frame, or sooner as dictated by the severity of her symptoms. As always, she was instructed that for any urgent/emergent care needs, she should seek care either here or in the emergency department for more immediate evaluation.   Gertie Baron, DNP, NP-c    Gertie Baron, NP 01/15/19 [phone]

## 2019-01-15 NOTE — Discharge Instructions (Addendum)
You are being treated for dizziness/vertigo.   Take you medications as prescribed. Start the meclizine tonight. If you get sleepy, only take it at night time.   Add over-the-counter Allegra to you daily medications.   Follow-up with an ENT if your symptoms don't get better.

## 2019-01-26 ENCOUNTER — Other Ambulatory Visit: Payer: Self-pay | Admitting: Family Medicine

## 2019-01-26 DIAGNOSIS — Z780 Asymptomatic menopausal state: Secondary | ICD-10-CM

## 2019-11-06 ENCOUNTER — Encounter: Payer: Self-pay | Admitting: Emergency Medicine

## 2019-11-06 ENCOUNTER — Ambulatory Visit
Admission: EM | Admit: 2019-11-06 | Discharge: 2019-11-06 | Disposition: A | Discharge status: Home / Self Care | Payer: Medicare Other | Attending: Internal Medicine | Admitting: Internal Medicine

## 2019-11-06 ENCOUNTER — Other Ambulatory Visit: Payer: Self-pay

## 2019-11-06 DIAGNOSIS — R112 Nausea with vomiting, unspecified: Secondary | ICD-10-CM | POA: Diagnosis present

## 2019-11-06 LAB — CBC WITH DIFFERENTIAL/PLATELET
Abs Immature Granulocytes: 0.04 10*3/uL (ref 0.00–0.07)
Basophils Absolute: 0.1 10*3/uL (ref 0.0–0.1)
Basophils Relative: 1 %
Eosinophils Absolute: 0.1 10*3/uL (ref 0.0–0.5)
Eosinophils Relative: 1 %
HCT: 45.1 % (ref 36.0–46.0)
Hemoglobin: 15.3 g/dL — ABNORMAL HIGH (ref 12.0–15.0)
Immature Granulocytes: 0 %
Lymphocytes Relative: 18 %
Lymphs Abs: 2 10*3/uL (ref 0.7–4.0)
MCH: 30.8 pg (ref 26.0–34.0)
MCHC: 33.9 g/dL (ref 30.0–36.0)
MCV: 90.7 fL (ref 80.0–100.0)
Monocytes Absolute: 0.7 10*3/uL (ref 0.1–1.0)
Monocytes Relative: 7 %
Neutro Abs: 8.4 10*3/uL — ABNORMAL HIGH (ref 1.7–7.7)
Neutrophils Relative %: 73 %
Platelets: 269 10*3/uL (ref 150–400)
RBC: 4.97 MIL/uL (ref 3.87–5.11)
RDW: 13 % (ref 11.5–15.5)
WBC: 11.3 10*3/uL — ABNORMAL HIGH (ref 4.0–10.5)
nRBC: 0 % (ref 0.0–0.2)

## 2019-11-06 LAB — COMPREHENSIVE METABOLIC PANEL
ALT: 14 U/L (ref 0–44)
AST: 17 U/L (ref 15–41)
Albumin: 4.6 g/dL (ref 3.5–5.0)
Alkaline Phosphatase: 79 U/L (ref 38–126)
Anion gap: 12 (ref 5–15)
BUN: 16 mg/dL (ref 8–23)
CO2: 29 mmol/L (ref 22–32)
Calcium: 9.8 mg/dL (ref 8.9–10.3)
Chloride: 99 mmol/L (ref 98–111)
Creatinine, Ser: 0.76 mg/dL (ref 0.44–1.00)
GFR calc Af Amer: >60 mL/min (ref >60)
GFR calc non Af Amer: >60 mL/min (ref >60)
Glucose, Bld: 95 mg/dL (ref 70–99)
Potassium: 3.7 mmol/L (ref 3.5–5.1)
Sodium: 140 mmol/L (ref 135–145)
Total Bilirubin: 0.6 mg/dL (ref 0.3–1.2)
Total Protein: 8.1 g/dL (ref 6.5–8.1)

## 2019-11-06 MED ORDER — ONDANSETRON 4 MG PO TBDP: 4.0000 mg | ORAL_TABLET | Freq: Three times a day (TID) | ORAL | 0 refills | Status: DC | PRN

## 2019-11-06 MED ORDER — MECLIZINE HCL 12.5 MG PO TABS: 12.5000 mg | ORAL_TABLET | Freq: Three times a day (TID) | ORAL | 0 refills | Status: AC | PRN

## 2019-11-06 NOTE — ED Triage Notes (Signed)
Patient c/o dizziness and nausea that started on Tuesday and got better but then started back on Friday.  Patient reports headache for the past 3-4 days.

## 2019-11-06 NOTE — ED Provider Notes (Signed)
[facility]    CSN: [id number] Arrival date & time: 11/06/19  1347      History   Chief Complaint Chief Complaint  Patient presents with  . Dizziness  . Nausea    HPI Cathy Keith is a 74 y.o. female comes to the urgent care with complaints of nausea,vomiting and dizziness on 3 to 4 days duration.  Patient missed a couple days of her routine medications.  She has a history of vertigo but has not required meclizine recently.  She denies any fever, chills, or diarrhea.  No sick contacts.  She has been fully vaccinated against COVID-19 virus.  No ear pain or ringing.  He had no chest pain or chest pressure.  No cold or heat intolerance.   HPI  Past Medical History:  Diagnosis Date  . Asthma   . Hypertension     There are no problems to display for this patient.   Past Surgical History:  Procedure Laterality Date  . ABDOMINAL HYSTERECTOMY      OB History   No obstetric history on file.      Home Medications    Prior to Admission medications   Medication Sig Start Date End Date Taking? Authorizing Provider  diltiazem (CARTIA XT) 180 MG 24 hr capsule TAKE 1 CAPSULE(180 MG) BY MOUTH EVERY DAY 02/02/18  Yes [provider]  donepezil (ARICEPT) 10 MG tablet Take by mouth. 10/20/19  Yes [provider]  ELIQUIS 5 MG TABS tablet  07/15/17  Yes [provider]  montelukast (SINGULAIR) 10 MG tablet Take 10 mg by mouth at bedtime.   Yes [provider]  benzonatate (TESSALON) 200 MG capsule Take one cap TID PRN cough 04/02/18   Lorin Picket, PA-C  chlorpheniramine-HYDROcodone [facility]) 10-8 MG/5ML SUER Take 5 mLs by mouth 2 (two) times daily. 04/02/18   Lorin Picket, PA-C  fluticasone (FLONASE) 50 MCG/ACT nasal spray Place 1 spray into both nostrils 2 (two) times daily. 01/15/19   Gertie Baron, NP  fluticasone furoate-vilanterol (BREO ELLIPTA) 100-25 MCG/INH AEPB INHALE 1 PUFF INTO THE LUNGS  EVERY DAY 10/20/17   [provider]  meclizine (ANTIVERT) 12.5 MG tablet Take 1 tablet (12.5 mg total) by mouth 3 (three) times daily as needed for dizziness. 11/06/19   Chase Picket, MD  ondansetron (ZOFRAN ODT) 4 MG disintegrating tablet Take 1 tablet (4 mg total) by mouth every 8 (eight) hours as needed for nausea or vomiting. 11/06/19   Kvion Shapley, Myrene Galas, MD    Family History Family History  Problem Relation Age of Onset  . Dementia Mother   . Heart disease Father   . Heart attack Father     Social History Social History   Tobacco Use  . Smoking status: Never Smoker  . Smokeless tobacco: Never Used  Vaping Use  . Vaping Use: Never used  Substance Use Topics  . Alcohol use: Not Currently    Comment: occasional  . Drug use: No     Allergies   Patient has no known allergies.   Review of Systems Review of Systems  Constitutional: Negative.   Respiratory: Negative.   Gastrointestinal: Negative.   Genitourinary: Negative.   Neurological: Positive for dizziness, light-headedness and headaches. Negative for tremors.  Psychiatric/Behavioral: Negative.  Negative for confusion and decreased concentration.     Physical Exam Triage Vital Signs ED Triage Vitals  Enc Vitals Group     BP 11/06/19 1517 128/70  Pulse Rate 11/06/19 1517 (!) 55     Resp 11/06/19 1517 14     Temp 11/06/19 1517 97.7 F (36.5 C)     Temp Source 11/06/19 1517 Oral     SpO2 11/06/19 1517 99 %     Weight 11/06/19 1512 138 lb (62.6 kg)     Height 11/06/19 1512 5\' 4"  (1.626 m)     Head Circumference --      Peak Flow --      Pain Score 11/06/19 1512 4     Pain Loc --      Pain Edu? --      Excl. in [place]? --    No data found.  Updated Vital Signs BP 128/70 (BP Location: Left Arm)   Pulse (!) 55   Temp 97.7 F (36.5 C) (Oral)   Resp 14   Ht 5\' 4"  (1.626 m)   Wt 62.6 kg   SpO2 99%   BMI 23.69 kg/m   Visual Acuity Right Eye Distance:   Left Eye Distance:   Bilateral  Distance:    Right Eye Near:   Left Eye Near:    Bilateral Near:     Physical Exam Vitals and nursing note reviewed.  Constitutional:      Appearance: Normal appearance. She is not ill-appearing.  HENT:     Left Ear: Tympanic membrane normal.     Ears:     Comments: Perforated right eardrum no surrounding erythema.  Adequately this is chronic Cardiovascular:     Rate and Rhythm: Normal rate.     Pulses: Normal pulses.     Heart sounds: Normal heart sounds.  Pulmonary:     Effort: Pulmonary effort is normal.     Breath sounds: Normal breath sounds.  Abdominal:     General: Bowel sounds are normal.     Palpations: Abdomen is soft.  Skin:    Capillary Refill: Capillary refill takes less than 2 seconds.  Neurological:     General: No focal deficit present.     Mental Status: She is alert and oriented to person, place, and time. Mental status is at baseline.     Cranial Nerves: No cranial nerve deficit.     Sensory: No sensory deficit.      UC Treatments / Results  Labs (all labs ordered are listed, but only abnormal results are displayed) Labs Reviewed  CBC WITH DIFFERENTIAL/PLATELET - Abnormal; Notable for the following components:      Result Value   WBC 11.3 (*)    Hemoglobin 15.3 (*)    Neutro Abs 8.4 (*)    All other components within normal limits  COMPREHENSIVE METABOLIC PANEL    EKG   Radiology No results found.  Procedures Procedures (including critical care time)  Medications Ordered in UC Medications - No data to display  Initial Impression / Assessment and Plan / UC Course  I have reviewed the triage vital signs and the nursing notes.  Pertinent labs & imaging results that were available during my care of the patient were reviewed by me and considered in my medical decision making (see chart for details).     1.  Nausea vomiting: CBC, CMP, TSH Zofran as needed for nausea/vomiting Tylenol/Motrin as needed headaches. Meclizine as needed for  dizziness. Return precautions If patient symptoms are worse she is advised evaluation including imaging. Final Clinical Impressions(s) / UC Diagnoses   Final diagnoses:  Nausea and vomiting in adult     Discharge  Instructions     Increase oral fluid intake If your symptoms worsen please return to the urgent care to be reevaluated. If your lab work is abnormal we will call you to update your plan of care.    ED Prescriptions    Medication Sig Dispense Auth. Provider   ondansetron (ZOFRAN ODT) 4 MG disintegrating tablet Take 1 tablet (4 mg total) by mouth every 8 (eight) hours as needed for nausea or vomiting. 20 tablet Christinna Sprung, Myrene Galas, MD   meclizine (ANTIVERT) 12.5 MG tablet Take 1 tablet (12.5 mg total) by mouth 3 (three) times daily as needed for dizziness. 30 tablet Tamia Dial, Myrene Galas, MD     PDMP not reviewed this encounter.   Chase Picket, MD 11/07/19 2329

## 2019-11-06 NOTE — Discharge Instructions (Signed)
Increase oral fluid intake If your symptoms worsen please return to the urgent care to be reevaluated. If your lab work is abnormal we will call you to update your plan of care.

## 2020-05-31 ENCOUNTER — Encounter: Payer: Self-pay | Admitting: Emergency Medicine

## 2020-05-31 ENCOUNTER — Other Ambulatory Visit: Payer: Self-pay

## 2020-05-31 ENCOUNTER — Ambulatory Visit (INDEPENDENT_AMBULATORY_CARE_PROVIDER_SITE_OTHER): Payer: Medicare Other

## 2020-05-31 ENCOUNTER — Ambulatory Visit
Admission: EM | Admit: 2020-05-31 | Discharge: 2020-05-31 | Disposition: A | Discharge status: Home / Self Care | Payer: Medicare Other | Attending: Physician Assistant | Admitting: Physician Assistant

## 2020-05-31 DIAGNOSIS — M7989 Other specified soft tissue disorders: Secondary | ICD-10-CM | POA: Diagnosis not present

## 2020-05-31 DIAGNOSIS — W109XXA Fall (on) (from) unspecified stairs and steps, initial encounter: Secondary | ICD-10-CM | POA: Diagnosis not present

## 2020-05-31 DIAGNOSIS — M79671 Pain in right foot: Secondary | ICD-10-CM

## 2020-05-31 DIAGNOSIS — W19XXXA Unspecified fall, initial encounter: Secondary | ICD-10-CM | POA: Diagnosis not present

## 2020-05-31 HISTORY — DX: Unspecified atrial fibrillation: I48.91

## 2020-05-31 NOTE — Discharge Instructions (Addendum)
FOOT PAIN: No fractures seen on imagine. Stressed avoiding painful activities . Reviewed RICE guidelines. Use medications as directed, including Tylenol for pain relief. If no improvement in the next 1-2 weeks, f/u with PCP or return to our office for reexamination, and please feel free to call or return at any time for any questions or concerns you may have and we will be happy to help you!      If at any point you develop headaches, vision changes, dizziness, weakness, balance or speech problems, numbness, tingling, weakness, nausea or vomiting have someone take you to ED for evaluation. You say you did not injure your head or pass out, but are at increased risk for intracranial bleeding if you do so if you develop any of those symptoms you will need ED evaluation.

## 2020-05-31 NOTE — ED Triage Notes (Signed)
Patient in today c/o right foot pain after falling down steps earlier today in her home. Patient has not taken any OTC medications for her pain, but did ice her foot briefly.

## 2020-05-31 NOTE — ED Provider Notes (Signed)
[facility]    CSN: [id number] Arrival date & time: 05/31/20  1305      History   Chief Complaint Chief Complaint  Patient presents with  . Fall    DOI 05/31/20  . Foot Injury    right    HPI Cathy Keith is a 75 y.o. female presenting for injury of right foot. Patient says she was going down a few steps (about 3) when she tripped and fell.  She was wearing slippers and states that her floor is carpeted.  She has swelling and pain of the right foot.  Admits to pain on weightbearing of the right foot.  She says that she had immediate swelling after the injury.  She has been a little bit of ice on it but has not taken anything for pain relief.  Denies any numbness, tingling or weakness.  She denies other injuries in the fall. Denies head injury or LOC. No headaches, dizziness, weakness, vision changes, balance or speech problems, n/v, or n/t/w.  No other concerns to report.  HPI  Past Medical History:  Diagnosis Date  . Asthma   . Atrial fibrillation ([place])   . Hypertension     There are no problems to display for this patient.   Past Surgical History:  Procedure Laterality Date  . ABDOMINAL HYSTERECTOMY      OB History   No obstetric history on file.      Home Medications    Prior to Admission medications   Medication Sig Start Date End Date Taking? Authorizing Provider  diltiazem (CARDIZEM CD) 180 MG 24 hr capsule TAKE 1 CAPSULE(180 MG) BY MOUTH EVERY DAY 02/02/18  Yes [provider]  ELIQUIS 5 MG TABS tablet  07/15/17  Yes [provider]  fluticasone (FLONASE) 50 MCG/ACT nasal spray Place 1 spray into both nostrils 2 (two) times daily. 01/15/19  Yes Gertie Baron, NP  fluticasone furoate-vilanterol (BREO ELLIPTA) 100-25 MCG/INH AEPB INHALE 1 PUFF INTO THE LUNGS EVERY DAY 10/20/17  Yes [provider]  montelukast (SINGULAIR) 10 MG tablet Take 10 mg by mouth at bedtime.   Yes [provider]  benzonatate  (TESSALON) 200 MG capsule Take one cap TID PRN cough 04/02/18   Lorin Picket, PA-C  chlorpheniramine-HYDROcodone [facility]) 10-8 MG/5ML SUER Take 5 mLs by mouth 2 (two) times daily. 04/02/18   Lorin Picket, PA-C  donepezil (ARICEPT) 10 MG tablet Take by mouth. 10/20/19   [provider]  meclizine (ANTIVERT) 12.5 MG tablet Take 1 tablet (12.5 mg total) by mouth 3 (three) times daily as needed for dizziness. 11/06/19   Chase Picket, MD  ondansetron (ZOFRAN ODT) 4 MG disintegrating tablet Take 1 tablet (4 mg total) by mouth every 8 (eight) hours as needed for nausea or vomiting. 11/06/19   Lamptey, Myrene Galas, MD    Family History Family History  Problem Relation Age of Onset  . Dementia Mother   . Heart disease Father   . Heart attack Father     Social History Social History   Tobacco Use  . Smoking status: Never Smoker  . Smokeless tobacco: Never Used  Vaping Use  . Vaping Use: Never used  Substance Use Topics  . Alcohol use: Not Currently    Comment: occasional  . Drug use: No     Allergies   Other   Review of Systems Review of Systems  Constitutional: Negative for fatigue.  Eyes: Negative for photophobia and visual  disturbance.  Gastrointestinal: Negative for nausea and vomiting.  Musculoskeletal: Positive for arthralgias (right foot), gait problem and joint swelling (right dorsal foot). Negative for neck pain.  Skin: Negative for wound.  Neurological: Negative for dizziness, syncope, weakness, numbness and headaches.  Hematological: Bruises/bleeds easily (on Eliquis).     Physical Exam Triage Vital Signs ED Triage Vitals  Enc Vitals Group     BP 05/31/20 1318 127/67     Pulse Rate 05/31/20 1318 65     Resp 05/31/20 1318 18     Temp 05/31/20 1318 98.5 F (36.9 C)     Temp Source 05/31/20 1318 Oral     SpO2 05/31/20 1318 99 %     Weight 05/31/20 1319 120 lb (54.4 kg)     Height 05/31/20 1319 5' 5.5" (1.664 m)     Head  Circumference --      Peak Flow --      Pain Score 05/31/20 1319 10     Pain Loc --      Pain Edu? --      Excl. in [place]? --    No data found.  Updated Vital Signs BP 127/67 (BP Location: Left Arm)   Pulse 65   Temp 98.5 F (36.9 C) (Oral)   Resp 18   Ht 5' 5.5" (1.664 m)   Wt 120 lb (54.4 kg)   SpO2 99%   BMI 19.67 kg/m    Physical Exam Vitals and nursing note reviewed.  Constitutional:      General: She is not in acute distress.    Appearance: Normal appearance. She is not ill-appearing or toxic-appearing.  HENT:     Head: Normocephalic and atraumatic.     Nose: Nose normal.     Mouth/Throat:     Mouth: Mucous membranes are moist.     Pharynx: Oropharynx is clear.  Eyes:     General: No scleral icterus.       Right eye: No discharge.        Left eye: No discharge.     Extraocular Movements: Extraocular movements intact.     Conjunctiva/sclera: Conjunctivae normal.     Pupils: Pupils are equal, round, and reactive to light.  Cardiovascular:     Rate and Rhythm: Normal rate and regular rhythm.     Pulses: Normal pulses.     Heart sounds: Normal heart sounds.  Pulmonary:     Effort: Pulmonary effort is normal. No respiratory distress.     Breath sounds: Normal breath sounds.  Musculoskeletal:     Cervical back: Neck supple.     Right foot: Normal range of motion. Swelling (diffuse moderate swelling of dorsal foo) and tenderness (diffuse poorly localized tenderness of dorsal midfoot) present.  Skin:    General: Skin is dry.  Neurological:     General: No focal deficit present.     Mental Status: She is alert and oriented to person, place, and time. Mental status is at baseline.     Cranial Nerves: No cranial nerve deficit.     Motor: No weakness.     Gait: Gait abnormal.  Psychiatric:        Mood and Affect: Mood normal.        Behavior: Behavior normal.        Thought Content: Thought content normal.      UC Treatments / Results  Labs (all labs ordered  are listed, but only abnormal results are displayed) Labs Reviewed - No data to  display  EKG   Radiology No results found.  Procedures Procedures (including critical care time)  Medications Ordered in UC Medications - No data to display  Initial Impression / Assessment and Plan / UC Course  I have reviewed the triage vital signs and the nursing notes.  Pertinent labs & imaging results that were available during my care of the patient were reviewed by me and considered in my medical decision making (see chart for details).   75 year old female presenting for left foot pain following an injury today.  Patient fell and injured her right foot but no other injuries reported.  Exam only significant for swelling of the dorsal right foot as well as tenderness throughout the midfoot.  The rest exam is normal.  Neurologically intact.  X-ray obtained today which does not show any fractures.  Reviewed results with patient.  Advised on supportive care for foot contusion.  Advised RICE and Tylenol for pain relief.  Follow-up with her clinic as needed.  ED precautions for falls reviewed with patient.   Final Clinical Impressions(s) / UC Diagnoses   Final diagnoses:  Right foot pain  Swelling of right foot  Fall, initial encounter     Discharge Instructions     FOOT PAIN: Stressed avoiding painful activities . Reviewed RICE guidelines. Use medications as directed, including Tylenol for pain relief. If no improvement in the next 1-2 weeks, f/u with PCP or return to our office for reexamination, and please feel free to call or return at any time for any questions or concerns you may have and we will be happy to help you!      If at any point you develop headaches, vision changes, dizziness, weakness, balance or speech problems, numbness, tingling, weakness, nausea or vomiting have someone take you to ED for evaluation. You say you did not injure your head or pass out, but are at increased  risk for intracranial bleeding if you do so if you develop any of those symptoms you will need ED evaluation.    ED Prescriptions    None     PDMP not reviewed this encounter.   Danton Clap, PA-C 05/31/20 1431

## 2020-12-12 ENCOUNTER — Other Ambulatory Visit: Payer: Self-pay | Admitting: Neurosurgery

## 2020-12-12 DIAGNOSIS — M5136 Other intervertebral disc degeneration, lumbar region: Secondary | ICD-10-CM

## 2020-12-12 DIAGNOSIS — M545 Low back pain, unspecified: Secondary | ICD-10-CM

## 2020-12-12 DIAGNOSIS — M5416 Radiculopathy, lumbar region: Secondary | ICD-10-CM

## 2020-12-21 ENCOUNTER — Other Ambulatory Visit: Payer: Self-pay

## 2020-12-21 ENCOUNTER — Ambulatory Visit
Admission: RE | Admit: 2020-12-21 | Discharge: 2020-12-21 | Disposition: A | Discharge status: Home / Self Care | Payer: Medicare Other | Source: Ambulatory Visit | Attending: Neurosurgery | Admitting: Neurosurgery

## 2020-12-21 DIAGNOSIS — M5416 Radiculopathy, lumbar region: Secondary | ICD-10-CM | POA: Diagnosis not present

## 2020-12-21 DIAGNOSIS — M5136 Other intervertebral disc degeneration, lumbar region: Secondary | ICD-10-CM | POA: Insufficient documentation

## 2020-12-21 DIAGNOSIS — M545 Low back pain, unspecified: Secondary | ICD-10-CM | POA: Insufficient documentation

## 2022-11-27 ENCOUNTER — Ambulatory Visit
Admission: EM | Admit: 2022-11-27 | Discharge: 2022-11-27 | Disposition: A | Discharge status: Home / Self Care | Payer: Medicare Other

## 2022-11-27 ENCOUNTER — Encounter: Payer: Self-pay | Admitting: Emergency Medicine

## 2022-11-27 DIAGNOSIS — R21 Rash and other nonspecific skin eruption: Secondary | ICD-10-CM | POA: Diagnosis not present

## 2022-11-27 DIAGNOSIS — R112 Nausea with vomiting, unspecified: Secondary | ICD-10-CM | POA: Diagnosis not present

## 2022-11-27 DIAGNOSIS — T63441A Toxic effect of venom of bees, accidental (unintentional), initial encounter: Secondary | ICD-10-CM | POA: Diagnosis not present

## 2022-11-27 MED ORDER — ONDANSETRON 4 MG PO TBDP: 4.0000 mg | ORAL_TABLET | Freq: Four times a day (QID) | ORAL | 0 refills | Status: AC | PRN

## 2022-11-27 MED ORDER — ONDANSETRON HCL 4 MG/2ML IJ SOLN
8.0000 mg | Freq: Once | INTRAMUSCULAR | Status: AC
  Administered 2022-11-27: 8 mg via INTRAMUSCULAR

## 2022-11-27 MED ORDER — DIPHENHYDRAMINE HCL 50 MG/ML IJ SOLN
50.0000 mg | Freq: Once | INTRAMUSCULAR | Status: AC
  Administered 2022-11-27: 50 mg via INTRAMUSCULAR

## 2022-11-27 NOTE — ED Triage Notes (Signed)
Pt presents with bee sting all over her body that occurred around 4:15pm. Pt feels nauseous and vomited once while at the clinic.

## 2022-11-27 NOTE — Discharge Instructions (Addendum)
-  Symptoms have improved with the IM Benadryl and nausea medication. - Give 1-2 Benadryl every 6 hours until rash is completely resolved and for a day or so after. - May apply ice to areas of rash. - I sent the nausea medication to the pharmacy. - Increase rest and fluids. - Monitor symptoms very closely and if at any point there is hives like rash all over body, any associated swelling of face, mouth, lips, tongue, numbness/tingling of mouth or face, chest tightness, throat tightness, wheezing, breathing difficulty, worsening abdominal pain, increased vomiting despite use of nausea medication, weakness, please contact 911 immediately.

## 2022-11-27 NOTE — ED Provider Notes (Signed)
[facility]    CSN: [id number] Arrival date & time: 11/27/22  1717      History   Chief Complaint Chief Complaint  Patient presents with   Insect Bite    HPI Cathy Keith is a 77 y.o. female with history of asthma, atrial fibrillation, hypertension, and mild dementia.   Patient presents with her son for evaluation of rash after being stung by "at least 5 bees at 4:15 PM."  No medications been given.  She has rash on her left neck, left arm, right thigh and left upper chest where she was stung.  No hives.  Patient denies facial swelling, tongue swelling, throat swelling, throat tightness, pruritus, chest pain/tightness, wheezing or difficulty.  She has had 2 episodes of vomiting since being in the urgent care.  She is reporting some abdominal cramping.  Patient contacted family medicine provider.  Son states they told her not to take any medication and to be evaluated in urgent care.  Patient is apparently never an stung by bee so she did not know she would have a reaction to it.   HPI  Past Medical History:  Diagnosis Date   Asthma    Atrial fibrillation (HCC)    Hypertension     There are no problems to display for this patient.   Past Surgical History:  Procedure Laterality Date   ABDOMINAL HYSTERECTOMY      OB History   No obstetric history on file.      Home Medications    Prior to Admission medications   Medication Sig Start Date End Date Taking? Authorizing Provider  atorvastatin (LIPITOR) 20 MG tablet Take by mouth. 04/24/22  Yes [provider]  ondansetron (ZOFRAN-ODT) 4 MG disintegrating tablet Take 1 tablet (4 mg total) by mouth every 6 (six) hours as needed for nausea or vomiting. 11/27/22  Yes Eusebio Friendly B, PA-C  benzonatate (TESSALON) 200 MG capsule Take one cap TID PRN cough 04/02/18   Lutricia Feil, PA-C  chlorpheniramine-HYDROcodone [facility]) 10-8 MG/5ML SUER Take 5 mLs by mouth 2 (two) times daily.  04/02/18   Lutricia Feil, PA-C  diltiazem (CARDIZEM CD) 180 MG 24 hr capsule TAKE 1 CAPSULE(180 MG) BY MOUTH EVERY DAY 02/02/18   [provider]  donepezil (ARICEPT) 10 MG tablet Take by mouth. 10/20/19   [provider]  ELIQUIS 5 MG TABS tablet  07/15/17   [provider]  fluticasone (FLONASE) 50 MCG/ACT nasal spray Place 1 spray into both nostrils 2 (two) times daily. 01/15/19   Bailey Mech, NP  fluticasone furoate-vilanterol (BREO ELLIPTA) 100-25 MCG/INH AEPB INHALE 1 PUFF INTO THE LUNGS EVERY DAY 10/20/17   [provider]  meclizine (ANTIVERT) 12.5 MG tablet Take 1 tablet (12.5 mg total) by mouth 3 (three) times daily as needed for dizziness. 11/06/19   Merrilee Jansky, MD  memantine (NAMENDA) 10 MG tablet Take 10 mg by mouth 2 (two) times daily.    [provider]  montelukast (SINGULAIR) 10 MG tablet Take 10 mg by mouth at bedtime.    [provider]    Family History Family History  Problem Relation Age of Onset   Dementia Mother    Heart disease Father    Heart attack Father     Social History Social History   Tobacco Use   Smoking status: Never   Smokeless tobacco: Never  Vaping Use   Vaping status: Never Used  Substance Use Topics  Alcohol use: Not Currently    Comment: occasional   Drug use: No     Allergies   Other   Review of Systems Review of Systems  Constitutional:  Negative for fatigue.  HENT:  Negative for congestion, facial swelling, rhinorrhea, trouble swallowing and voice change.   Respiratory:  Negative for cough, chest tightness, shortness of breath and wheezing.   Cardiovascular:  Negative for chest pain and palpitations.  Gastrointestinal:  Positive for abdominal pain, nausea and vomiting.  Skin:  Positive for rash.  Neurological:  Negative for dizziness, weakness and headaches.     Physical Exam Triage Vital Signs ED Triage Vitals  Encounter Vitals Group     BP 11/27/22  1813 (!) 164/78     Systolic BP Percentile --      Diastolic BP Percentile --      Pulse Rate 11/27/22 1813 85     Resp 11/27/22 1813 18     Temp 11/27/22 1813 97.9 F (36.6 C)     Temp Source 11/27/22 1813 Oral     SpO2 11/27/22 1813 99 %     Weight --      Height --      Head Circumference --      Peak Flow --      Pain Score 11/27/22 1811 8     Pain Loc --      Pain Education --      Exclude from Growth Chart --    No data found.  Updated Vital Signs BP (!) 148/70 (BP Location: Right Arm)   Pulse 85   Temp 97.9 F (36.6 C) (Oral)   Resp 18   SpO2 96%   Physical Exam Vitals and nursing note reviewed.  Constitutional:      General: She is not in acute distress.    Appearance: Normal appearance. She is not ill-appearing or toxic-appearing.     Comments: Patient with an episode of vomiting in the exam room when I evaluate her.  HENT:     Head: Normocephalic and atraumatic.     Nose: Nose normal.     Mouth/Throat:     Mouth: Mucous membranes are moist.     Pharynx: Oropharynx is clear.     Comments: No facial swelling or intraoral swelling. Eyes:     General: No scleral icterus.       Right eye: No discharge.        Left eye: No discharge.     Conjunctiva/sclera: Conjunctivae normal.  Cardiovascular:     Rate and Rhythm: Normal rate and regular rhythm.     Heart sounds: Normal heart sounds.  Pulmonary:     Effort: Pulmonary effort is normal. No respiratory distress.     Breath sounds: Normal breath sounds.  Abdominal:     Palpations: Abdomen is soft.     Tenderness: There is no abdominal tenderness.  Musculoskeletal:     Cervical back: Neck supple.  Skin:    General: Skin is dry.     Findings: Rash (Few erythematous papules of left neck, upper chest, left arm, right thigh--consistent with insect sting.) present.  Neurological:     General: No focal deficit present.     Mental Status: She is alert. Mental status is at baseline.     Motor: No weakness.      Gait: Gait normal.  Psychiatric:        Mood and Affect: Mood normal.        Behavior:  Behavior normal.        Thought Content: Thought content normal.      UC Treatments / Results  Labs (all labs ordered are listed, but only abnormal results are displayed) Labs Reviewed - No data to display  EKG   Radiology No results found.  Procedures Procedures (including critical care time)  Medications Ordered in UC Medications  diphenhydrAMINE (BENADRYL) injection 50 mg (50 mg Intramuscular Given 11/27/22 1845)  ondansetron (ZOFRAN) injection 8 mg (8 mg Intramuscular Given 11/27/22 1847)    Initial Impression / Assessment and Plan / UC Course  I have reviewed the triage vital signs and the nursing notes.  Pertinent labs & imaging results that were available during my care of the patient were reviewed by me and considered in my medical decision making (see chart for details).   77 year old female presents with son for evaluation of rash after multiple bee stings.  Patient was apparently stung by at least 5 bees around 4:15 PM.  Presented to urgent care about 1 hour later.  Patient reported having an episode of vomiting while waiting in the lobby but staff not aware.  Patient evaluated by provider at 6:15 PM.  At that time noted to have multiple insect bites on body--left neck, left upper chest, left arm, right thigh.  No intraoral swelling, throat swelling, voice hoarseness, difficulty swallowing, difficulty breathing.  Patient only complaining of some abdominal cramping, nausea and the rash.  Patient given 50 mg IM Benadryl and 8 mg ODT Zofran at 6:45 PM.  Will closely monitor patient.  If status changes may consider giving epi and sending to emergency department.  Patient's rash nearly resolved after medications and nausea resolved as well.  No further vomiting.  Patient monitored until 7:45 PM in urgent care.  She was becoming tired and ready to go home and rest.  Son will stay with her  tonight and monitor.  We discussed giving Benadryl and Zofran.  Increase rest and fluids.  Discussed signs for anaphylaxis but have low suspicion for anaphylaxis at this time as symptoms began over 3-1/2 hours ago and have improved with antihistamines and Zofran.  Will continue this regimen.  Thoroughly discussed ED precautions.   Final Clinical Impressions(s) / UC Diagnoses   Final diagnoses:  Allergic reaction to bee sting  Rash  Nausea and vomiting, unspecified vomiting type     Discharge Instructions      -Symptoms have improved with the IM Benadryl and nausea medication. - Give 1-2 Benadryl every 6 hours until rash is completely resolved and for a day or so after. - May apply ice to areas of rash. - I sent the nausea medication to the pharmacy. - Increase rest and fluids. - Monitor symptoms very closely and if at any point there is hives like rash all over body, any associated swelling of face, mouth, lips, tongue, numbness/tingling of mouth or face, chest tightness, throat tightness, wheezing, breathing difficulty, worsening abdominal pain, increased vomiting despite use of nausea medication, weakness, please contact 911 immediately.     ED Prescriptions     Medication Sig Dispense Auth. Provider   ondansetron (ZOFRAN-ODT) 4 MG disintegrating tablet Take 1 tablet (4 mg total) by mouth every 6 (six) hours as needed for nausea or vomiting. 15 tablet Gareth Morgan      PDMP not reviewed this encounter.   Shirlee Latch, PA-C 11/27/22 1946

## 2023-06-30 ENCOUNTER — Ambulatory Visit: Payer: Self-pay
